# Patient Record
Sex: Female | Born: 1960 | Hispanic: Yes | Marital: Married | Smoking: Never smoker
Health system: Southern US, Community
[De-identification: ages and names within clinical notes are randomized; demographics above are authoritative.]

## PROBLEM LIST (undated history)

## (undated) DIAGNOSIS — F329 Major depressive disorder, single episode, unspecified: Secondary | ICD-10-CM

## (undated) DIAGNOSIS — I10 Essential (primary) hypertension: Secondary | ICD-10-CM

## (undated) DIAGNOSIS — F32A Depression, unspecified: Secondary | ICD-10-CM

## (undated) DIAGNOSIS — I509 Heart failure, unspecified: Secondary | ICD-10-CM

## (undated) DIAGNOSIS — R32 Unspecified urinary incontinence: Secondary | ICD-10-CM

## (undated) HISTORY — PX: OVARY SURGERY: SHX727

## (undated) HISTORY — DX: Essential (primary) hypertension: I10

## (undated) HISTORY — PX: ABDOMINAL HYSTERECTOMY: SHX81

## (undated) HISTORY — DX: Heart failure, unspecified: I50.9

## (undated) HISTORY — DX: Unspecified urinary incontinence: R32

---

## 1984-05-14 HISTORY — PX: SALPINGOOPHORECTOMY: SHX82

## 2015-02-21 ENCOUNTER — Emergency Department: Payer: BC Managed Care – PPO

## 2015-02-21 ENCOUNTER — Emergency Department
Admission: EM | Admit: 2015-02-21 | Discharge: 2015-02-21 | Disposition: A | Discharge status: Home / Self Care | Payer: BC Managed Care – PPO | Attending: Emergency Medicine | Admitting: Emergency Medicine

## 2015-02-21 ENCOUNTER — Other Ambulatory Visit: Payer: Self-pay

## 2015-02-21 DIAGNOSIS — F419 Anxiety disorder, unspecified: Secondary | ICD-10-CM | POA: Insufficient documentation

## 2015-02-21 DIAGNOSIS — R519 Headache, unspecified: Secondary | ICD-10-CM

## 2015-02-21 DIAGNOSIS — R42 Dizziness and giddiness: Secondary | ICD-10-CM | POA: Diagnosis not present

## 2015-02-21 DIAGNOSIS — R51 Headache: Secondary | ICD-10-CM | POA: Insufficient documentation

## 2015-02-21 DIAGNOSIS — M6281 Muscle weakness (generalized): Secondary | ICD-10-CM | POA: Diagnosis not present

## 2015-02-21 DIAGNOSIS — R531 Weakness: Secondary | ICD-10-CM

## 2015-02-21 LAB — BASIC METABOLIC PANEL
ANION GAP: 6 (ref 5–15)
BUN: 17 mg/dL (ref 6–20)
CALCIUM: 10 mg/dL (ref 8.9–10.3)
CO2: 29 mmol/L (ref 22–32)
Chloride: 106 mmol/L (ref 101–111)
Creatinine, Ser: 0.85 mg/dL (ref 0.44–1.00)
GFR calc Af Amer: >60 mL/min (ref >60)
GLUCOSE: 106 mg/dL — AB (ref 65–99)
Potassium: 3.5 mmol/L (ref 3.5–5.1)
Sodium: 141 mmol/L (ref 135–145)

## 2015-02-21 LAB — CBC
HEMATOCRIT: 38.5 % (ref 35.0–47.0)
HEMOGLOBIN: 12.9 g/dL (ref 12.0–16.0)
MCH: 28 pg (ref 26.0–34.0)
MCHC: 33.5 g/dL (ref 32.0–36.0)
MCV: 83.7 fL (ref 80.0–100.0)
Platelets: 289 10*3/uL (ref 150–440)
RBC: 4.6 MIL/uL (ref 3.80–5.20)
RDW: 14.7 % — ABNORMAL HIGH (ref 11.5–14.5)
WBC: 6.7 10*3/uL (ref 3.6–11.0)

## 2015-02-21 LAB — TROPONIN I: Troponin I: <0.03 ng/mL (ref <0.031)

## 2015-02-21 MED ORDER — ONDANSETRON HCL 4 MG/2ML IJ SOLN
4.0000 mg | Freq: Once | INTRAMUSCULAR | Status: AC
  Administered 2015-02-21: 4 mg via INTRAVENOUS

## 2015-02-21 MED ORDER — SODIUM CHLORIDE 0.9 % IV SOLN
1000.0000 mL | Freq: Once | INTRAVENOUS | Status: AC
  Administered 2015-02-21: 1000 mL via INTRAVENOUS

## 2015-02-21 MED ORDER — ONDANSETRON HCL 4 MG/2ML IJ SOLN
INTRAMUSCULAR | Status: AC
  Administered 2015-02-21: 4 mg via INTRAVENOUS
  Filled 2015-02-21: qty 2

## 2015-02-21 MED ORDER — KETOROLAC TROMETHAMINE 30 MG/ML IJ SOLN
30.0000 mg | Freq: Once | INTRAMUSCULAR | Status: AC
  Administered 2015-02-21: 30 mg via INTRAVENOUS
  Filled 2015-02-21: qty 1

## 2015-02-21 NOTE — Discharge Instructions (Signed)
Debilidad  (Weakness)  La debilidad es la falta de energa. Puede sentir debilidad en todo el cuerpo o slo en [employer]. La debilidad puede ser un sntoma grave. En algunos casos podra necesitar hacer ms estudios diagnsticos. CUIDADOS EN EL HOGAR   Haga reposo.  Consuma una dieta normal y bien balanceada.  Trate de Materials engineer [employer].  Tome slo los medicamentos que le haya indicado el mdico. SOLICITE AYUDA DE INMEDIATO SI:   No puede hacer sus actividades habituales.  No puede subir ni bajar escaleras o se siente muy cansado al [employer].  Siente que le falta el aire o dolor en el pecho.  Tiene dificultad para mover algunas partes de su cuerpo.  Siente debilidad en una parte del cuerpo o slo en un lado del cuerpo.  Tiene fiebre.  Tiene problemas para hablar o tragar.  No puede controlar el pis (la orina) o las heces (materia fecal).  El (vmito) o las heces son negras o Investment banker, operational.  La debilidad empeora o se extiende a otras partes del cuerpo.  Siente nuevos dolores. ASEGRESE DE QUE:   Comprende estas instrucciones.  Controlar su enfermedad.  Solicitar ayuda de inmediato si no mejora o si empeora.   Esta informacin no tiene Theme park manager el consejo del mdico. Asegrese de hacerle al mdico cualquier pregunta que tenga.   Document Released: 07/27/2008 Document Revised: 10/30/2011 Elsevier Interactive Patient Education [employer]

## 2015-02-21 NOTE — ED Notes (Signed)
Pt presents from home with weakness especially in lower extremities that started yesterday morning. "it feels like my legs can't hold my weight". Pt states she has felt dizzy since yesterday even at rest/headache. Pt alert and oriented on arrival.

## 2015-02-21 NOTE — ED Notes (Signed)
AAOx3.  Skin warm and dry.  Moving all extremities equally and strong. Ambulates with easy and steady gait.   

## 2015-02-21 NOTE — ED Provider Notes (Signed)
[facility] Emergency Department Provider Note  ____________________________________________  Time seen: 1 PM  I have reviewed the triage vital signs and the nursing notes.   HISTORY  Chief Complaint Extremity Weakness; Headache; and Dizziness    HPI Savannah Myers is a 54 y.o. female who complains primarily of fatigue but also of a headache some dizziness, and generalized weakness. She denies fevers chills. She does report a localized general throbbing headache which is not uncommon for her. She does not have neck pain or back pain. No chest pain or shortness of breath. No recent travel. No lower extremity swelling. No nausea vomiting or abdominal pain     No past medical history on file.  There are no active problems to display for this patient.   No past surgical history on file.  No current outpatient prescriptions on file.  Allergies Review of patient's allergies indicates not on file.  No family history on file.  Social History Social History  Substance Use Topics  . Smoking status: Not on file  . Smokeless tobacco: Not on file  . Alcohol Use: Not on file    Review of Systems  Constitutional: Negative for fever. Generalized weakness Eyes: Negative for visual changes. ENT: Negative for sore throat Cardiovascular: Negative for chest pain. Respiratory: Negative for shortness of breath. Gastrointestinal: Negative for abdominal pain, vomiting and diarrhea. Genitourinary: Negative for dysuria. Musculoskeletal: Negative for back pain. Skin: Negative for rash. Neurological: Negative for  focal weakness. Positive for headache Psychiatric: Mild anxiety    ____________________________________________   PHYSICAL EXAM:  VITAL SIGNS: ED Triage Vitals  Enc Vitals Group     BP 02/21/15 1111 146/80 mmHg     Pulse Rate 02/21/15 1111 60     Resp 02/21/15 1111 16     Temp 02/21/15 1111 98 F (36.7 C)     Temp Source 02/21/15 1111  Oral     SpO2 02/21/15 1111 94 %     Weight 02/21/15 1111 152 lb (68.947 kg)     Height 02/21/15 1111  (1.676 m)     Head Cir --      Peak Flow --      Pain Score 02/21/15 1238 5     Pain Loc --      Pain Edu? --      Excl. in GC? --      Constitutional: Alert and oriented. Well appearing and in no distress. Eyes: Conjunctivae are normal.  ENT   Head: Normocephalic and atraumatic.   Mouth/Throat: Mucous membranes are moist. Cardiovascular: Normal rate, regular rhythm. Normal and symmetric distal pulses are present in all extremities. No murmurs, rubs, or gallops. Respiratory: Normal respiratory effort without tachypnea nor retractions. Breath sounds are clear and equal bilaterally.  Gastrointestinal: Soft and non-tender in all quadrants. No distention. There is no CVA tenderness. Genitourinary: deferred Musculoskeletal: Nontender with normal range of motion in all extremities. No lower extremity tenderness nor edema. Neurologic:  Normal speech and language. No gross focal neurologic deficits are appreciated. Normal strength in all extremities Skin:  Skin is warm, dry and intact. No rash noted. Psychiatric: Mood and affect are normal. Patient exhibits appropriate insight and judgment.  ____________________________________________    LABS (pertinent positives/negatives)  Labs Reviewed  BASIC METABOLIC PANEL - Abnormal; Notable for the following:    Glucose, Bld 106 (*)    All other components within normal limits  CBC - Abnormal; Notable for the following:    RDW 14.7 (*)  All other components within normal limits  TROPONIN I    ____________________________________________   EKG  ED ECG REPORT I, Jene Every, the attending physician, personally viewed and interpreted this ECG.  Date: 02/21/2015 EKG Time: 11:04 AM Rate: 59 Rhythm: Sinus bradycardia QRS Axis: normal Intervals: normal ST/T Wave abnormalities: normal Conduction Disutrbances:  none Narrative Interpretation: unremarkable   ____________________________________________    RADIOLOGY I have personally reviewed any xrays that were ordered on this patient: CT head unremarkable  ____________________________________________   PROCEDURES  Procedure(s) performed: none  Critical Care performed: none  ____________________________________________   INITIAL IMPRESSION / ASSESSMENT AND PLAN / ED COURSE  Pertinent labs & imaging results that were available during my care of the patient were reviewed by me and considered in my medical decision making (see chart for details).  Patient overall well-appearing with unremarkable vital signs. She complains of mild dizziness, fatigue and a headache. She is given Toradol IV for her headache which completely resolved her symptoms. Her CT head was unremarkable. She received IV fluids in the emergency department. She reports after the fluid she felt significant better and no longer feels dizzy or weak. I'll discharge the patient with supportive care and PCP follow-up. Instructions to return if any worsening symptoms to the ED  ____________________________________________   FINAL CLINICAL IMPRESSION(S) / ED DIAGNOSES  Final diagnoses:  Acute nonintractable headache, unspecified headache type  Generalized weakness     Jene Every, MD 02/21/15 1517

## 2015-02-23 DIAGNOSIS — R002 Palpitations: Secondary | ICD-10-CM | POA: Insufficient documentation

## 2015-02-28 DIAGNOSIS — I10 Essential (primary) hypertension: Secondary | ICD-10-CM | POA: Insufficient documentation

## 2015-03-07 DIAGNOSIS — R42 Dizziness and giddiness: Secondary | ICD-10-CM | POA: Insufficient documentation

## 2015-03-17 ENCOUNTER — Ambulatory Visit: Payer: BC Managed Care – PPO | Admitting: Cardiovascular Disease

## 2015-03-23 ENCOUNTER — Ambulatory Visit: Payer: BC Managed Care – PPO | Admitting: Nurse Practitioner

## 2015-07-14 ENCOUNTER — Other Ambulatory Visit: Payer: Self-pay | Admitting: Internal Medicine

## 2015-07-14 DIAGNOSIS — Z1239 Encounter for other screening for malignant neoplasm of breast: Secondary | ICD-10-CM

## 2015-07-14 DIAGNOSIS — Z862 Personal history of diseases of the blood and blood-forming organs and certain disorders involving the immune mechanism: Secondary | ICD-10-CM | POA: Insufficient documentation

## 2015-07-25 ENCOUNTER — Ambulatory Visit: Payer: BC Managed Care – PPO | Attending: Internal Medicine

## 2015-08-22 DIAGNOSIS — I493 Ventricular premature depolarization: Secondary | ICD-10-CM | POA: Insufficient documentation

## 2015-11-18 ENCOUNTER — Ambulatory Visit
Admission: RE | Admit: 2015-11-18 | Discharge: 2015-11-18 | Disposition: A | Discharge status: Home / Self Care | Payer: BC Managed Care – PPO | Source: Ambulatory Visit | Attending: Internal Medicine | Admitting: Internal Medicine

## 2015-11-18 ENCOUNTER — Other Ambulatory Visit: Payer: Self-pay | Admitting: Internal Medicine

## 2015-11-18 DIAGNOSIS — Z1231 Encounter for screening mammogram for malignant neoplasm of breast: Secondary | ICD-10-CM | POA: Diagnosis not present

## 2015-11-18 DIAGNOSIS — Z1239 Encounter for other screening for malignant neoplasm of breast: Secondary | ICD-10-CM

## 2015-11-23 ENCOUNTER — Other Ambulatory Visit: Payer: Self-pay | Admitting: Internal Medicine

## 2015-11-23 DIAGNOSIS — N6310 Unspecified lump in the right breast, unspecified quadrant: Secondary | ICD-10-CM

## 2015-11-24 ENCOUNTER — Ambulatory Visit
Admission: RE | Admit: 2015-11-24 | Discharge: 2015-11-24 | Disposition: A | Discharge status: Home / Self Care | Payer: BC Managed Care – PPO | Source: Ambulatory Visit | Attending: Internal Medicine | Admitting: Internal Medicine

## 2015-11-24 DIAGNOSIS — N63 Unspecified lump in breast: Secondary | ICD-10-CM | POA: Diagnosis not present

## 2015-11-24 DIAGNOSIS — N6310 Unspecified lump in the right breast, unspecified quadrant: Secondary | ICD-10-CM

## 2016-07-20 DIAGNOSIS — I10 Essential (primary) hypertension: Secondary | ICD-10-CM | POA: Diagnosis not present

## 2016-07-20 DIAGNOSIS — K219 Gastro-esophageal reflux disease without esophagitis: Secondary | ICD-10-CM | POA: Diagnosis not present

## 2016-07-20 DIAGNOSIS — F5104 Psychophysiologic insomnia: Secondary | ICD-10-CM | POA: Diagnosis not present

## 2016-07-20 DIAGNOSIS — E7800 Pure hypercholesterolemia, unspecified: Secondary | ICD-10-CM | POA: Diagnosis not present

## 2016-08-16 DIAGNOSIS — H5203 Hypermetropia, bilateral: Secondary | ICD-10-CM | POA: Diagnosis not present

## 2016-08-16 DIAGNOSIS — H52223 Regular astigmatism, bilateral: Secondary | ICD-10-CM | POA: Diagnosis not present

## 2016-08-16 DIAGNOSIS — H524 Presbyopia: Secondary | ICD-10-CM | POA: Diagnosis not present

## 2016-10-12 DIAGNOSIS — I10 Essential (primary) hypertension: Secondary | ICD-10-CM | POA: Diagnosis not present

## 2016-10-12 DIAGNOSIS — K219 Gastro-esophageal reflux disease without esophagitis: Secondary | ICD-10-CM | POA: Diagnosis not present

## 2016-10-12 DIAGNOSIS — Z131 Encounter for screening for diabetes mellitus: Secondary | ICD-10-CM | POA: Diagnosis not present

## 2016-10-12 DIAGNOSIS — E7800 Pure hypercholesterolemia, unspecified: Secondary | ICD-10-CM | POA: Diagnosis not present

## 2016-10-16 ENCOUNTER — Other Ambulatory Visit: Payer: Self-pay | Admitting: Internal Medicine

## 2016-10-16 DIAGNOSIS — Z1231 Encounter for screening mammogram for malignant neoplasm of breast: Secondary | ICD-10-CM

## 2016-10-19 DIAGNOSIS — R7303 Prediabetes: Secondary | ICD-10-CM | POA: Diagnosis not present

## 2016-10-19 DIAGNOSIS — S39012A Strain of muscle, fascia and tendon of lower back, initial encounter: Secondary | ICD-10-CM | POA: Insufficient documentation

## 2016-10-19 DIAGNOSIS — Z0000 Encounter for general adult medical examination without abnormal findings: Secondary | ICD-10-CM | POA: Diagnosis not present

## 2016-10-19 DIAGNOSIS — Z23 Encounter for immunization: Secondary | ICD-10-CM | POA: Diagnosis not present

## 2016-10-19 DIAGNOSIS — Z780 Asymptomatic menopausal state: Secondary | ICD-10-CM | POA: Diagnosis not present

## 2016-10-20 DIAGNOSIS — B351 Tinea unguium: Secondary | ICD-10-CM | POA: Insufficient documentation

## 2016-10-23 DIAGNOSIS — M8588 Other specified disorders of bone density and structure, other site: Secondary | ICD-10-CM | POA: Diagnosis not present

## 2016-11-22 ENCOUNTER — Ambulatory Visit: Payer: BC Managed Care – PPO

## 2017-01-22 DIAGNOSIS — B351 Tinea unguium: Secondary | ICD-10-CM | POA: Diagnosis not present

## 2017-02-01 DIAGNOSIS — J029 Acute pharyngitis, unspecified: Secondary | ICD-10-CM | POA: Diagnosis not present

## 2017-02-01 DIAGNOSIS — B349 Viral infection, unspecified: Secondary | ICD-10-CM | POA: Diagnosis not present

## 2017-02-08 ENCOUNTER — Ambulatory Visit
Admission: RE | Admit: 2017-02-08 | Discharge: 2017-02-08 | Disposition: A | Discharge status: Home / Self Care | Payer: BC Managed Care – PPO | Source: Ambulatory Visit | Attending: Internal Medicine | Admitting: Internal Medicine

## 2017-02-08 DIAGNOSIS — M533 Sacrococcygeal disorders, not elsewhere classified: Secondary | ICD-10-CM | POA: Diagnosis not present

## 2017-02-08 DIAGNOSIS — W108XXA Fall (on) (from) other stairs and steps, initial encounter: Secondary | ICD-10-CM | POA: Diagnosis not present

## 2017-02-08 DIAGNOSIS — Z1231 Encounter for screening mammogram for malignant neoplasm of breast: Secondary | ICD-10-CM | POA: Diagnosis not present

## 2017-02-11 DIAGNOSIS — E7800 Pure hypercholesterolemia, unspecified: Secondary | ICD-10-CM | POA: Insufficient documentation

## 2017-02-11 DIAGNOSIS — K219 Gastro-esophageal reflux disease without esophagitis: Secondary | ICD-10-CM | POA: Diagnosis not present

## 2017-02-11 DIAGNOSIS — I10 Essential (primary) hypertension: Secondary | ICD-10-CM | POA: Diagnosis not present

## 2017-02-11 DIAGNOSIS — I83813 Varicose veins of bilateral lower extremities with pain: Secondary | ICD-10-CM | POA: Diagnosis not present

## 2017-03-06 ENCOUNTER — Encounter (INDEPENDENT_AMBULATORY_CARE_PROVIDER_SITE_OTHER): Payer: Self-pay | Admitting: Vascular Surgery

## 2017-03-06 ENCOUNTER — Ambulatory Visit (INDEPENDENT_AMBULATORY_CARE_PROVIDER_SITE_OTHER): Payer: BLUE CROSS/BLUE SHIELD | Admitting: Vascular Surgery

## 2017-03-06 VITALS — BP 137/77 | HR 51 | Resp 17 | Ht 60.0 in | Wt 165.0 lb

## 2017-03-06 DIAGNOSIS — I83813 Varicose veins of bilateral lower extremities with pain: Secondary | ICD-10-CM | POA: Insufficient documentation

## 2017-03-06 DIAGNOSIS — R600 Localized edema: Secondary | ICD-10-CM | POA: Diagnosis not present

## 2017-03-06 DIAGNOSIS — E785 Hyperlipidemia, unspecified: Secondary | ICD-10-CM | POA: Diagnosis not present

## 2017-03-06 NOTE — Progress Notes (Signed)
Subjective:    Patient ID: Savannah Myers, female    DOB: 09/06/1960, 56 y.o.   MRN: [id number] Chief Complaint  Patient presents with  . Varicose Veins    ref Birdie HopesSingh   Presents as a new patient referred by Dr. Leta SpellerSignh for evaluation of "painful varicose veins". Patient endorses a long-standing history of progressively painful varicose veins located to the bilateral lower extremity. Patient states that she was "treated" in her country many years ago. ? Sclerotherapy. She states her varicosities have become larger and more painful over the last few years. This prompted her to seek medical attention. Patient notes her discomfort increases towards the end of the day. Her discomfort is also associated with lower extremity edema. The edema is located mostly to the calf and ankles. She states this worsens towards the end of the day. She denies any claudication, rest pain or ulceration to the lower extremity. She denies any recent surgery or trauma to the lower extremity. She denies any DVT history to the lower extremity. At this time, the patient does not engage in conservative therapy. Patient denies any fever, nausea or vomiting.   Review of Systems  Constitutional: Negative.   HENT: Negative.   Eyes: Negative.   Respiratory: Negative.   Cardiovascular: Positive for leg swelling.       Varicose veins  Gastrointestinal: Negative.   Endocrine: Negative.   Genitourinary: Negative.   Musculoskeletal: Negative.   Skin: Negative.   Allergic/Immunologic: Negative.   Neurological: Negative.   Hematological: Negative.   Psychiatric/Behavioral: Negative.       Objective:   Physical Exam  Constitutional: She is oriented to person, place, and time. She appears well-developed and well-nourished. No distress.  HENT:  Head: Normocephalic and atraumatic.  Eyes: Conjunctivae are normal.  Neck: Normal range of motion.  Cardiovascular: Normal rate, regular rhythm, normal heart sounds and intact  distal pulses.   Pulses:      Radial pulses are 2+ on the right side, and 2+ on the left side.       Dorsalis pedis pulses are 2+ on the right side, and 2+ on the left side.       Posterior tibial pulses are 2+ on the right side, and 2+ on the left side.  Pulmonary/Chest: Effort normal.  Musculoskeletal: Normal range of motion. She exhibits edema (mild bilateral lower extremity edema).  Neurological: She is alert and oriented to person, place, and time.  Skin: She is not diaphoretic.  Less than 1 cm scattered spider veins noted. Less than 1 cm varicosities noted. There is no cellulitis. There is no stasis dermatitis. There is no skin changes.  Psychiatric: She has a normal mood and affect. Her behavior is normal. Judgment and thought content normal.  Vitals reviewed.  BP 137/77 (BP Location: Right Arm)   Pulse (!) 51   Resp 17   Ht 5' (1.524 m)   Wt 165 lb (74.8 kg)   LMP  (LMP Unknown)   BMI 32.22 kg/m   Past Medical History:  Diagnosis Date  . Hypertension    Social History   Social History  . Marital status: Married    Spouse name: N/A  . Number of children: N/A  . Years of education: N/A   Occupational History  . Not on file.   Social History Main Topics  . Smoking status: Never Smoker  . Smokeless tobacco: Never Used  . Alcohol use No  . Drug use: No  . Sexual  activity: Not on file   Other Topics Concern  . Not on file   Social History Narrative  . No narrative on file   Past Surgical History:  Procedure Laterality Date  . ABDOMINAL HYSTERECTOMY     Family History  Problem Relation Age of Onset  . Breast cancer Maternal Grandmother 8   No Known Allergies     Assessment & Plan:  Presents as a new patient referred by Dr. Leta Speller for evaluation of "painful varicose veins". Patient endorses a long-standing history of progressively painful varicose veins located to the bilateral lower extremity. Patient states that she was "treated" in her country many  years ago. ? Sclerotherapy. She states her varicosities have become larger and more painful over the last few years. This prompted her to seek medical attention. Patient notes her discomfort increases towards the end of the day. Her discomfort is also associated with lower extremity edema. The edema is located mostly to the calf and ankles. She states this worsens towards the end of the day. She denies any claudication, rest pain or ulceration to the lower extremity. She denies any recent surgery or trauma to the lower extremity. She denies any DVT history to the lower extremity. At this time, the patient does not engage in conservative therapy. Patient denies any fever, nausea or vomiting.  1. Varicose veins of both lower extremities with pain - New  The patient was encouraged to wear graduated compression stockings (20-30 mmHg) on a daily basis. The patient was instructed to begin wearing the stockings first thing in the morning and removing them in the evening. The patient was instructed specifically not to sleep in the stockings. Prescription given.  In addition, behavioral modification including elevation during the day will be initiated. Anti-inflammatories for pain. The patient will follow up in three months to asses conservative management.  Information on chronic venous insufficiency and compression stockings was given to the patient. The patient was instructed to call the office in the interim if any worsening edema or ulcerations to the legs, feet or toes occurs. The patient expresses their understanding.  - VAS [place] LOWER EXTREMITY VENOUS REFLUX; Future  2. Hyperlipidemia, unspecified hyperlipidemia type - Stable Encouraged good control as its slows the progression of atherosclerotic disease  3. Bilateral lower extremity edema - New As above  No current outpatient prescriptions on file prior to visit.   No current facility-administered medications on file prior to visit.     There  are no Patient Instructions on file for this visit. No Follow-up on file.   Marlow Berenguer A Jamari Diana, PA-C

## 2017-03-13 ENCOUNTER — Encounter (INDEPENDENT_AMBULATORY_CARE_PROVIDER_SITE_OTHER): Payer: BC Managed Care – PPO | Admitting: Vascular Surgery

## 2017-03-13 DIAGNOSIS — I10 Essential (primary) hypertension: Secondary | ICD-10-CM | POA: Diagnosis not present

## 2017-03-13 DIAGNOSIS — I493 Ventricular premature depolarization: Secondary | ICD-10-CM | POA: Diagnosis not present

## 2017-03-13 DIAGNOSIS — R002 Palpitations: Secondary | ICD-10-CM | POA: Diagnosis not present

## 2017-03-13 DIAGNOSIS — E782 Mixed hyperlipidemia: Secondary | ICD-10-CM | POA: Diagnosis not present

## 2017-03-16 ENCOUNTER — Emergency Department: Payer: BLUE CROSS/BLUE SHIELD

## 2017-03-16 ENCOUNTER — Emergency Department
Admission: EM | Admit: 2017-03-16 | Discharge: 2017-03-17 | Disposition: A | Discharge status: Home / Self Care | Payer: BLUE CROSS/BLUE SHIELD | Attending: Emergency Medicine | Admitting: Emergency Medicine

## 2017-03-16 DIAGNOSIS — S8001XA Contusion of right knee, initial encounter: Secondary | ICD-10-CM | POA: Insufficient documentation

## 2017-03-16 DIAGNOSIS — Y929 Unspecified place or not applicable: Secondary | ICD-10-CM | POA: Insufficient documentation

## 2017-03-16 DIAGNOSIS — Z79899 Other long term (current) drug therapy: Secondary | ICD-10-CM | POA: Insufficient documentation

## 2017-03-16 DIAGNOSIS — I10 Essential (primary) hypertension: Secondary | ICD-10-CM | POA: Diagnosis not present

## 2017-03-16 DIAGNOSIS — W108XXA Fall (on) (from) other stairs and steps, initial encounter: Secondary | ICD-10-CM | POA: Diagnosis not present

## 2017-03-16 DIAGNOSIS — T148XXA Other injury of unspecified body region, initial encounter: Secondary | ICD-10-CM

## 2017-03-16 DIAGNOSIS — Y998 Other external cause status: Secondary | ICD-10-CM | POA: Insufficient documentation

## 2017-03-16 DIAGNOSIS — Y9389 Activity, other specified: Secondary | ICD-10-CM | POA: Diagnosis not present

## 2017-03-16 DIAGNOSIS — M25561 Pain in right knee: Secondary | ICD-10-CM | POA: Diagnosis not present

## 2017-03-16 DIAGNOSIS — W109XXA Fall (on) (from) unspecified stairs and steps, initial encounter: Secondary | ICD-10-CM | POA: Diagnosis not present

## 2017-03-16 DIAGNOSIS — S8991XA Unspecified injury of right lower leg, initial encounter: Secondary | ICD-10-CM | POA: Diagnosis present

## 2017-03-16 DIAGNOSIS — S50312A Abrasion of left elbow, initial encounter: Secondary | ICD-10-CM | POA: Diagnosis not present

## 2017-03-16 HISTORY — DX: Depression, unspecified: F32.A

## 2017-03-16 HISTORY — DX: Major depressive disorder, single episode, unspecified: F32.9

## 2017-03-16 MED ORDER — KETOROLAC TROMETHAMINE 30 MG/ML IJ SOLN
15.0000 mg | Freq: Once | INTRAMUSCULAR | Status: AC
  Administered 2017-03-16: 15 mg via INTRAVENOUS

## 2017-03-16 MED ORDER — NAPROXEN 500 MG PO TABS: 500.0000 mg | ORAL_TABLET | Freq: Two times a day (BID) | ORAL | 0 refills | Status: DC

## 2017-03-16 MED ORDER — KETOROLAC TROMETHAMINE 30 MG/ML IJ SOLN
INTRAMUSCULAR | Status: AC
  Administered 2017-03-16: 15 mg via INTRAVENOUS
  Filled 2017-03-16: qty 1

## 2017-03-16 NOTE — ED Notes (Signed)
Clean, sterile dressing applied to abrasion on patient's left elbow

## 2017-03-16 NOTE — ED Notes (Signed)
X-ray at bedside

## 2017-03-16 NOTE — Discharge Instructions (Signed)
Your knee xray was unremarkable.  We don't see any serious injuries from the fall.  Use crutches and anti-inflammatory pain medication to control your symptoms and follow up with your doctor.

## 2017-03-16 NOTE — ED Provider Notes (Signed)
[facility] Emergency Department Provider Note  ____________________________________________  Time seen: Approximately 11:38 PM  I have reviewed the triage vital signs and the nursing notes.   HISTORY  Chief Complaint Fall (knee, hip and elbow pain)    HPI Savannah Myers is a 56 y.o. female who reports a mechanical fall where she slipped and fell at the top of the stairs while reaching for a medicine for her child. She fell down a few steps was able to catch herself with her arm against the railing. No head injury or loss of consciousness. Denies neck pain. Complains of pain in the right thigh and knee. Also at the left elbow. No chest pain shortness of breath or abdominal pain. No bleeding or lacerations. Pains are nonradiating, worse with movement.     Past Medical History:  Diagnosis Date  . Depression   . Hypertension      Patient Active Problem List   Diagnosis Date Noted  . Varicose veins of both lower extremities with pain 03/06/2017  . Hyperlipidemia 03/06/2017  . Bilateral lower extremity edema 03/06/2017     Past Surgical History:  Procedure Laterality Date  . ABDOMINAL HYSTERECTOMY       Prior to Admission medications   Medication Sig Start Date End Date Taking? Authorizing Provider  candesartan (ATACAND) 32 MG tablet Take 32 mg by mouth daily.   Yes [provider]  Melatonin 5 MG TABS Take by mouth.   Yes [provider]  meloxicam (MOBIC) 7.5 MG tablet Take 1-2 tablets daily as needed for pain 10/19/16  Yes [provider]  pantoprazole (PROTONIX) 40 MG tablet Take 40 mg by mouth daily.    Yes [provider]  naproxen (NAPROSYN) 500 MG tablet Take 1 tablet (500 mg total) by mouth 2 (two) times daily with a meal. 03/16/17   Sharman CheekStafford, Divine Hansley, MD     Allergies Patient has no known allergies.   Family History  Problem Relation Age of Onset  . Breast cancer Maternal Grandmother [id number]     Social History Social History  Substance Use Topics  . Smoking status: Never Smoker  . Smokeless tobacco: Never Used  . Alcohol use No    Review of Systems  Constitutional:   No fever or chills.  ENT:   No sore throat. No rhinorrhea. Cardiovascular:   No chest pain or syncope. Respiratory:   No dyspnea or cough. Gastrointestinal:   Negative for abdominal pain, vomiting and diarrhea.  Musculoskeletal:   Multiple musculoskeletal complaints as above All other systems reviewed and are negative except as documented above in ROS and HPI.  ____________________________________________   PHYSICAL EXAM:  VITAL SIGNS: ED Triage Vitals  Enc Vitals Group     BP 03/16/17 2210 (!) 171/125     Pulse Rate 03/16/17 2210 73     Resp 03/16/17 2210 17     Temp 03/16/17 2210 98.1 F (36.7 C)     Temp Source 03/16/17 2210 Oral     SpO2 03/16/17 2210 100 %     Weight 03/16/17 2211 165 lb (74.8 kg)     Height 03/16/17 2211 5' (1.524 m)     Head Circumference --      Peak Flow --      Pain Score 03/16/17 2209 10     Pain Loc --      Pain Edu? --      Excl. in GC? --     Vital signs reviewed,  nursing assessments reviewed.   Constitutional:   Alert and oriented. Well appearing and in no distress. Tearful Eyes:   No scleral icterus.  EOMI. perrl. ENT   Head:   Normocephalic and atraumatic.   Nose:   No congestion/rhinnorhea.    Mouth/Throat:   MMM, no pharyngeal erythema. No peritonsillar mass.    Neck:   No meningismus. Full ROM. No midline tenderness Hematological/Lymphatic/Immunilogical:   No cervical lymphadenopathy. Cardiovascular:   RRR. Symmetric bilateral radial and DP pulses.  No murmurs.  Respiratory:   Normal respiratory effort without tachypnea/retractions. Breath sounds are clear and equal bilaterally. No wheezes/rales/rhonchi. Gastrointestinal:   Soft and nontender. Non distended. There is no CVA tenderness.  No rebound, rigidity, or  guarding. Genitourinary:   deferred Musculoskeletal:   Normal range of motion of the right hip. Mild tenderness diffusely at the posterior right knee, joint is located, no bony point tenderness. Joint is stable. Lower right leg and left leg are unremarkable. Patient resists range of motion of the right knee due to pain, but is able to move it. Neurologic:   Normal speech and language.  Motor grossly intact. No gross focal neurologic deficits are appreciated.  Skin:    Skin is warm, dry with a 1 cm superficial abrasion on the left elbow involving only the epidermis. No rash noted.  No petechiae, purpura, or bullae.  ____________________________________________    LABS (pertinent positives/negatives) (all labs ordered are listed, but only abnormal results are displayed) Labs Reviewed - No data to display ____________________________________________   EKG    ____________________________________________    RADIOLOGY  Dg Knee Complete 4 Views Right  Result Date: 03/16/2017 CLINICAL DATA:  56 year old female with right knee pain after fall. EXAM: RIGHT KNEE - COMPLETE 4+ VIEW COMPARISON:  None. FINDINGS: There is no acute fracture or dislocation. No significant arthritic changes. No joint effusion. The soft tissues appear unremarkable. IMPRESSION: Negative. Electronically Signed   By: Elgie Collard M.D.   On: 03/16/2017 23:03    ____________________________________________   PROCEDURES Procedures  ____________________________________________   DIFFERENTIAL DIAGNOSIS  Knee sprain, contusion, fracture. Left elbow abrasion  CLINICAL IMPRESSION / ASSESSMENT AND PLAN / ED COURSE  Pertinent labs & imaging results that were available during my care of the patient were reviewed by me and considered in my medical decision making (see chart for details).   Patient presents after minor mechanical fall. She is tearful which she relates to being upset about the fall and worried  that she may have a serious injury. However, exam is actually quite reassuring. X-ray of the right knee is negative. Vital signs are unremarkable. We'll provide Ace wrap and crutches for support and comfort. NSAIDs, follow up with primary care. Doubt any significant ligamentous injury. No evidence of dislocation or vascular injury. No spinal or neuro imaging needed.      ____________________________________________   FINAL CLINICAL IMPRESSION(S) / ED DIAGNOSES    Final diagnoses:  Contusion of right knee, initial encounter  Abrasion      New Prescriptions   NAPROXEN (NAPROSYN) 500 MG TABLET    Take 1 tablet (500 mg total) by mouth 2 (two) times daily with a meal.     Portions of this note were generated with dragon dictation software. Dictation errors may occur despite best attempts at proofreading.    Sharman Cheek, MD 03/16/17 2342

## 2017-03-16 NOTE — ED Triage Notes (Signed)
Patient reports mechanical fall today. Patient c/o right hip/knee pain and left elbow pain. Patient has abrasion to left elbow

## 2017-03-17 NOTE — ED Notes (Signed)
Reviewed d/c instructions, follow-up care, prescription, use of ice/elevation, crutch use, ace bandage use with patient. Patient verbalized understanding. Patient performed return demonstration of crutch use.

## 2017-03-19 DIAGNOSIS — I10 Essential (primary) hypertension: Secondary | ICD-10-CM | POA: Diagnosis not present

## 2017-03-19 DIAGNOSIS — M25561 Pain in right knee: Secondary | ICD-10-CM | POA: Diagnosis not present

## 2017-06-10 ENCOUNTER — Ambulatory Visit (INDEPENDENT_AMBULATORY_CARE_PROVIDER_SITE_OTHER): Payer: BLUE CROSS/BLUE SHIELD | Admitting: Vascular Surgery

## 2017-06-10 ENCOUNTER — Encounter (INDEPENDENT_AMBULATORY_CARE_PROVIDER_SITE_OTHER): Payer: BLUE CROSS/BLUE SHIELD

## 2017-06-17 ENCOUNTER — Encounter (INDEPENDENT_AMBULATORY_CARE_PROVIDER_SITE_OTHER): Payer: Self-pay | Admitting: Vascular Surgery

## 2017-06-17 ENCOUNTER — Ambulatory Visit (INDEPENDENT_AMBULATORY_CARE_PROVIDER_SITE_OTHER): Payer: BLUE CROSS/BLUE SHIELD

## 2017-06-17 ENCOUNTER — Ambulatory Visit (INDEPENDENT_AMBULATORY_CARE_PROVIDER_SITE_OTHER): Payer: BLUE CROSS/BLUE SHIELD | Admitting: Vascular Surgery

## 2017-06-17 VITALS — BP 124/75 | HR 63 | Resp 17 | Wt 166.0 lb

## 2017-06-17 DIAGNOSIS — I83813 Varicose veins of bilateral lower extremities with pain: Secondary | ICD-10-CM

## 2017-06-17 DIAGNOSIS — R600 Localized edema: Secondary | ICD-10-CM | POA: Diagnosis not present

## 2017-06-18 ENCOUNTER — Encounter (INDEPENDENT_AMBULATORY_CARE_PROVIDER_SITE_OTHER): Payer: Self-pay | Admitting: Vascular Surgery

## 2017-06-18 NOTE — Progress Notes (Signed)
Subjective:    Patient ID: Savannah Myers, female    DOB: 04/08/1961, 57 y.o.   MRN: [id number] Chief Complaint  Patient presents with  . Follow-up    29month bil ven reflux   Patient presents for a 5673-month lower extremity edema follow-up.  Over the last 3 months, the patient has been engaging in conservative therapy including wearing medical grade 1 compression stockings, elevating her legs and remaining active with some improvement to her edema and discomfort.  [name] symptoms have improved "a little".  The patient underwent a bilateral venous duplex which was notable for no reflux in either the superficial or deep system.  No deep vein or superficial thrombophlebitis noted.  The patient continues to deny claudication-like symptoms rest pain or ulceration to the lower extremity.  The patient denies any fever, nausea vomiting.   Review of Systems  Constitutional: Negative.   HENT: Negative.   Eyes: Negative.   Respiratory: Negative.   Cardiovascular: Positive for leg swelling.       Varicose veins  Gastrointestinal: Negative.   Endocrine: Negative.   Genitourinary: Negative.   Musculoskeletal: Negative.   Skin: Negative.   Allergic/Immunologic: Negative.   Neurological: Negative.   Hematological: Negative.   Psychiatric/Behavioral: Negative.       Objective:   Physical Exam  Constitutional: She is oriented to person, place, and time. She appears well-developed and well-nourished. No distress.  HENT:  Head: Normocephalic and atraumatic.  Eyes: Conjunctivae are normal. Pupils are equal, round, and reactive to light.  Neck: Normal range of motion.  Cardiovascular: Normal rate, regular rhythm, normal heart sounds and intact distal pulses.  Pulses:      Radial pulses are 2+ on the right side, and 2+ on the left side.  Pulmonary/Chest: Effort normal and breath sounds normal.  Musculoskeletal: Normal range of motion. She exhibits no edema.  Neurological: She is alert  and oriented to person, place, and time.  Skin: She is not diaphoretic.  Less than 1 cm scattered varicose veins to the bilateral lower extremity.  There is no stasis dermatitis, ulcerations, cellulitis noted to the bilateral lower extremity  Psychiatric: She has a normal mood and affect. Her behavior is normal. Judgment and thought content normal.  Vitals reviewed.  BP 124/75 (BP Location: Right Arm)   Pulse 63   Resp 17   Wt 166 lb (75.3 kg)   LMP  (LMP Unknown)   BMI 32.42 kg/m   Past Medical History:  Diagnosis Date  . Depression   . Hypertension    Social History   Socioeconomic History  . Marital status: Married    Spouse name: Not on file  . Number of children: Not on file  . Years of education: Not on file  . Highest education level: Not on file  Social Needs  . Financial resource strain: Not on file  . Food insecurity - worry: Not on file  . Food insecurity - inability: Not on file  . Transportation needs - medical: Not on file  . Transportation needs - non-medical: Not on file  Occupational History  . Not on file  Tobacco Use  . Smoking status: Never Smoker  . Smokeless tobacco: Never Used  Substance and Sexual Activity  . Alcohol use: No  . Drug use: No  . Sexual activity: Not on file  Other Topics Concern  . Not on file  Social History Narrative  . Not on file   Past Surgical History:  Procedure  Laterality Date  . ABDOMINAL HYSTERECTOMY     Family History  Problem Relation Age of Onset  . Breast cancer Maternal Grandmother 52   No Known Allergies     Assessment & Plan:  Patient presents for a 81-month lower extremity edema follow-up.  Over the last 3 months, the patient has been engaging in conservative therapy including wearing medical grade 1 compression stockings, elevating her legs and remaining active with some improvement to her edema and discomfort.  [name] symptoms have improved "a little".  The patient underwent a bilateral venous  duplex which was notable for no reflux in either the superficial or deep system.  No deep vein or superficial thrombophlebitis noted.  The patient continues to deny claudication-like symptoms rest pain or ulceration to the lower extremity.  The patient denies any fever, nausea vomiting.  1. Bilateral lower extremity edema - Stable There is some improvement to the patient's bilateral lower extremity edema and discomfort to her varicosities engaging in conservative therapy over the last 3 months. The patient is not a candidate for endovenous laser ablation and she does not have any venous reflux located to the superficial venous system. We discussed applying for a lymphedema pump however at this time, the patient is not interested in pursuing this therapy. The patient is to continue engaging in conservative therapy if she should change her mind about the lymphedema pump she should call the office and I will be happy to apply for it The patient should elevate her legs heart level or higher The patient should remain active To follow-up as needed  2. Varicose veins of both lower extremities with pain - Stable As needed  Current Outpatient Medications on File Prior to Visit  Medication Sig Dispense Refill  . candesartan (ATACAND) 32 MG tablet Take 32 mg by mouth daily.    . Melatonin 5 MG TABS Take by mouth.    . meloxicam (MOBIC) 7.5 MG tablet Take 1-2 tablets daily as needed for pain    . naproxen (NAPROSYN) 500 MG tablet Take 1 tablet (500 mg total) by mouth 2 (two) times daily with a meal. 20 tablet 0  . pantoprazole (PROTONIX) 40 MG tablet Take 40 mg by mouth daily.      No current facility-administered medications on file prior to visit.    There are no Patient Instructions on file for this visit. No Follow-up on file.  Orin Eberwein A Annisten Manchester, PA-C

## 2017-09-06 DIAGNOSIS — H524 Presbyopia: Secondary | ICD-10-CM | POA: Diagnosis not present

## 2017-09-06 DIAGNOSIS — H5203 Hypermetropia, bilateral: Secondary | ICD-10-CM | POA: Diagnosis not present

## 2017-09-06 DIAGNOSIS — H52223 Regular astigmatism, bilateral: Secondary | ICD-10-CM | POA: Diagnosis not present

## 2017-10-22 DIAGNOSIS — R7303 Prediabetes: Secondary | ICD-10-CM | POA: Diagnosis not present

## 2017-10-22 DIAGNOSIS — E782 Mixed hyperlipidemia: Secondary | ICD-10-CM | POA: Diagnosis not present

## 2017-10-22 DIAGNOSIS — E7800 Pure hypercholesterolemia, unspecified: Secondary | ICD-10-CM | POA: Diagnosis not present

## 2017-10-22 DIAGNOSIS — I10 Essential (primary) hypertension: Secondary | ICD-10-CM | POA: Diagnosis not present

## 2017-10-29 ENCOUNTER — Other Ambulatory Visit: Payer: Self-pay | Admitting: Internal Medicine

## 2017-10-29 DIAGNOSIS — Z6832 Body mass index (BMI) 32.0-32.9, adult: Secondary | ICD-10-CM | POA: Insufficient documentation

## 2017-10-29 DIAGNOSIS — Z0000 Encounter for general adult medical examination without abnormal findings: Secondary | ICD-10-CM | POA: Diagnosis not present

## 2017-10-29 DIAGNOSIS — R7303 Prediabetes: Secondary | ICD-10-CM | POA: Diagnosis not present

## 2017-10-29 DIAGNOSIS — Z1231 Encounter for screening mammogram for malignant neoplasm of breast: Secondary | ICD-10-CM | POA: Diagnosis not present

## 2017-12-26 ENCOUNTER — Emergency Department: Payer: BLUE CROSS/BLUE SHIELD

## 2017-12-26 ENCOUNTER — Emergency Department
Admission: EM | Admit: 2017-12-26 | Discharge: 2017-12-26 | Disposition: A | Discharge status: Home / Self Care | Payer: BLUE CROSS/BLUE SHIELD | Attending: Emergency Medicine | Admitting: Emergency Medicine

## 2017-12-26 ENCOUNTER — Other Ambulatory Visit: Payer: Self-pay

## 2017-12-26 ENCOUNTER — Encounter: Payer: Self-pay | Admitting: Emergency Medicine

## 2017-12-26 DIAGNOSIS — R51 Headache: Secondary | ICD-10-CM | POA: Diagnosis not present

## 2017-12-26 DIAGNOSIS — R079 Chest pain, unspecified: Secondary | ICD-10-CM | POA: Diagnosis not present

## 2017-12-26 DIAGNOSIS — I10 Essential (primary) hypertension: Secondary | ICD-10-CM | POA: Diagnosis not present

## 2017-12-26 DIAGNOSIS — M542 Cervicalgia: Secondary | ICD-10-CM | POA: Diagnosis not present

## 2017-12-26 DIAGNOSIS — Z79899 Other long term (current) drug therapy: Secondary | ICD-10-CM | POA: Insufficient documentation

## 2017-12-26 DIAGNOSIS — R0789 Other chest pain: Secondary | ICD-10-CM | POA: Diagnosis not present

## 2017-12-26 DIAGNOSIS — R202 Paresthesia of skin: Secondary | ICD-10-CM | POA: Insufficient documentation

## 2017-12-26 LAB — CBC
HCT: 43.6 % (ref 35.0–47.0)
HEMOGLOBIN: 14.7 g/dL (ref 12.0–16.0)
MCH: 28.2 pg (ref 26.0–34.0)
MCHC: 33.8 g/dL (ref 32.0–36.0)
MCV: 83.6 fL (ref 80.0–100.0)
Platelets: 285 10*3/uL (ref 150–440)
RBC: 5.22 MIL/uL — ABNORMAL HIGH (ref 3.80–5.20)
RDW: 14.9 % — AB (ref 11.5–14.5)
WBC: 6.1 10*3/uL (ref 3.6–11.0)

## 2017-12-26 LAB — BASIC METABOLIC PANEL
ANION GAP: 7 (ref 5–15)
BUN: 16 mg/dL (ref 6–20)
CHLORIDE: 108 mmol/L (ref 98–111)
CO2: 28 mmol/L (ref 22–32)
Calcium: 10.3 mg/dL (ref 8.9–10.3)
Creatinine, Ser: 0.7 mg/dL (ref 0.44–1.00)
GFR calc Af Amer: >60 mL/min (ref >60)
GFR calc non Af Amer: >60 mL/min (ref >60)
GLUCOSE: 114 mg/dL — AB (ref 70–99)
POTASSIUM: 3.8 mmol/L (ref 3.5–5.1)
Sodium: 143 mmol/L (ref 135–145)

## 2017-12-26 LAB — TROPONIN I: Troponin I: <0.03 ng/mL (ref <0.03)
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
## 2017-12-26 NOTE — BH Assessment (Signed)
Per the request of ER MD (Dr. Darnelle CatalanMalinda) writer spoke with patient about the events that took place at her job. Writer process with the patient and provided her with outpatient information and instruction on how to follow up.

## 2017-12-26 NOTE — ED Triage Notes (Signed)
Per hospital interpretor, pt has bad headache, right arm numb for several days, is being treated for htn, took meds this am. Cp last night, speech clear at this time. Tearful in triage.

## 2017-12-26 NOTE — ED Notes (Signed)
Patient returned from radiology and placed back on cardiac monitor at this time.  Patient denies any needs at this moment.

## 2017-12-26 NOTE — Discharge Instructions (Addendum)
The numbness in your arm may come from a pinched nerve in your neck.  These follow-up with your regular doctor.  They can schedule an MRI if needed.  This may help to see if anything further needs to be done. For the chest pain please follow-up with cardiology Dr. Marina GoodellPerry shows is on call.  If you call his office first thing in the morning they should be to get you in probably as early as tomorrow just to double check and make sure everything is okay. Please return for any further problems.

## 2017-12-30 DIAGNOSIS — I493 Ventricular premature depolarization: Secondary | ICD-10-CM | POA: Diagnosis not present

## 2017-12-30 DIAGNOSIS — I10 Essential (primary) hypertension: Secondary | ICD-10-CM | POA: Diagnosis not present

## 2017-12-30 DIAGNOSIS — R002 Palpitations: Secondary | ICD-10-CM | POA: Diagnosis not present

## 2017-12-30 DIAGNOSIS — I83813 Varicose veins of bilateral lower extremities with pain: Secondary | ICD-10-CM | POA: Diagnosis not present

## 2018-02-17 ENCOUNTER — Ambulatory Visit
Admission: RE | Admit: 2018-02-17 | Discharge: 2018-02-17 | Disposition: A | Discharge status: Home / Self Care | Payer: BLUE CROSS/BLUE SHIELD | Source: Ambulatory Visit | Attending: Internal Medicine | Admitting: Internal Medicine

## 2018-02-17 DIAGNOSIS — Z1231 Encounter for screening mammogram for malignant neoplasm of breast: Secondary | ICD-10-CM | POA: Diagnosis not present

## 2018-06-17 DIAGNOSIS — J111 Influenza due to unidentified influenza virus with other respiratory manifestations: Secondary | ICD-10-CM | POA: Diagnosis not present

## 2018-07-07 DIAGNOSIS — M9903 Segmental and somatic dysfunction of lumbar region: Secondary | ICD-10-CM | POA: Diagnosis not present

## 2018-07-07 DIAGNOSIS — M25561 Pain in right knee: Secondary | ICD-10-CM | POA: Diagnosis not present

## 2018-07-07 DIAGNOSIS — M5416 Radiculopathy, lumbar region: Secondary | ICD-10-CM | POA: Diagnosis not present

## 2018-07-09 DIAGNOSIS — M9903 Segmental and somatic dysfunction of lumbar region: Secondary | ICD-10-CM | POA: Diagnosis not present

## 2018-07-09 DIAGNOSIS — M25561 Pain in right knee: Secondary | ICD-10-CM | POA: Diagnosis not present

## 2018-07-09 DIAGNOSIS — M5416 Radiculopathy, lumbar region: Secondary | ICD-10-CM | POA: Diagnosis not present

## 2018-07-11 DIAGNOSIS — M5416 Radiculopathy, lumbar region: Secondary | ICD-10-CM | POA: Diagnosis not present

## 2018-07-11 DIAGNOSIS — M25561 Pain in right knee: Secondary | ICD-10-CM | POA: Diagnosis not present

## 2018-07-11 DIAGNOSIS — M9903 Segmental and somatic dysfunction of lumbar region: Secondary | ICD-10-CM | POA: Diagnosis not present

## 2018-07-23 DIAGNOSIS — I10 Essential (primary) hypertension: Secondary | ICD-10-CM | POA: Diagnosis not present

## 2018-07-23 DIAGNOSIS — E782 Mixed hyperlipidemia: Secondary | ICD-10-CM | POA: Diagnosis not present

## 2018-07-23 DIAGNOSIS — I493 Ventricular premature depolarization: Secondary | ICD-10-CM | POA: Diagnosis not present

## 2018-09-30 DIAGNOSIS — H5203 Hypermetropia, bilateral: Secondary | ICD-10-CM | POA: Diagnosis not present

## 2018-09-30 DIAGNOSIS — H524 Presbyopia: Secondary | ICD-10-CM | POA: Diagnosis not present

## 2018-09-30 DIAGNOSIS — H52223 Regular astigmatism, bilateral: Secondary | ICD-10-CM | POA: Diagnosis not present

## 2018-10-04 ENCOUNTER — Inpatient Hospital Stay
Admission: EM | Admit: 2018-10-04 | Discharge: 2018-10-05 | DRG: 392 | Disposition: A | Discharge status: Home / Self Care | Payer: BLUE CROSS/BLUE SHIELD | Attending: Internal Medicine | Admitting: Internal Medicine

## 2018-10-04 ENCOUNTER — Other Ambulatory Visit: Payer: Self-pay

## 2018-10-04 ENCOUNTER — Emergency Department: Payer: BLUE CROSS/BLUE SHIELD

## 2018-10-04 DIAGNOSIS — R112 Nausea with vomiting, unspecified: Secondary | ICD-10-CM

## 2018-10-04 DIAGNOSIS — K529 Noninfective gastroenteritis and colitis, unspecified: Secondary | ICD-10-CM

## 2018-10-04 DIAGNOSIS — I10 Essential (primary) hypertension: Secondary | ICD-10-CM | POA: Diagnosis not present

## 2018-10-04 DIAGNOSIS — R1110 Vomiting, unspecified: Secondary | ICD-10-CM | POA: Diagnosis not present

## 2018-10-04 DIAGNOSIS — Z79899 Other long term (current) drug therapy: Secondary | ICD-10-CM

## 2018-10-04 DIAGNOSIS — N390 Urinary tract infection, site not specified: Secondary | ICD-10-CM | POA: Diagnosis not present

## 2018-10-04 DIAGNOSIS — E785 Hyperlipidemia, unspecified: Secondary | ICD-10-CM | POA: Diagnosis not present

## 2018-10-04 DIAGNOSIS — Z20828 Contact with and (suspected) exposure to other viral communicable diseases: Secondary | ICD-10-CM | POA: Diagnosis present

## 2018-10-04 DIAGNOSIS — E869 Volume depletion, unspecified: Secondary | ICD-10-CM

## 2018-10-04 DIAGNOSIS — E860 Dehydration: Secondary | ICD-10-CM | POA: Diagnosis not present

## 2018-10-04 DIAGNOSIS — R1033 Periumbilical pain: Secondary | ICD-10-CM | POA: Diagnosis not present

## 2018-10-04 DIAGNOSIS — Z79890 Hormone replacement therapy (postmenopausal): Secondary | ICD-10-CM | POA: Diagnosis not present

## 2018-10-04 DIAGNOSIS — Z90710 Acquired absence of both cervix and uterus: Secondary | ICD-10-CM | POA: Diagnosis not present

## 2018-10-04 DIAGNOSIS — A09 Infectious gastroenteritis and colitis, unspecified: Principal | ICD-10-CM | POA: Diagnosis present

## 2018-10-04 DIAGNOSIS — E875 Hyperkalemia: Secondary | ICD-10-CM | POA: Diagnosis not present

## 2018-10-04 DIAGNOSIS — Z791 Long term (current) use of non-steroidal anti-inflammatories (NSAID): Secondary | ICD-10-CM | POA: Diagnosis not present

## 2018-10-04 LAB — URINALYSIS, COMPLETE (UACMP) WITH MICROSCOPIC
Bacteria, UA: NONE SEEN
Bilirubin Urine: NEGATIVE
Glucose, UA: NEGATIVE mg/dL
Hgb urine dipstick: NEGATIVE
Ketones, ur: 5 mg/dL — AB
Nitrite: NEGATIVE
Protein, ur: >=300 mg/dL — AB
Specific Gravity, Urine: 1.022 (ref 1.005–1.030)
pH: 9 — ABNORMAL HIGH (ref 5.0–8.0)

## 2018-10-04 LAB — COMPREHENSIVE METABOLIC PANEL
ALT: 24 U/L (ref 0–44)
AST: 37 U/L (ref 15–41)
Albumin: 5.3 g/dL — ABNORMAL HIGH (ref 3.5–5.0)
Alkaline Phosphatase: 73 U/L (ref 38–126)
Anion gap: 14 (ref 5–15)
BUN: 20 mg/dL (ref 6–20)
CO2: 25 mmol/L (ref 22–32)
Calcium: 11.2 mg/dL — ABNORMAL HIGH (ref 8.9–10.3)
Chloride: 102 mmol/L (ref 98–111)
Creatinine, Ser: 0.82 mg/dL (ref 0.44–1.00)
GFR calc Af Amer: >60 mL/min (ref >60)
GFR calc non Af Amer: >60 mL/min (ref >60)
Glucose, Bld: 155 mg/dL — ABNORMAL HIGH (ref 70–99)
Potassium: 5.2 mmol/L — ABNORMAL HIGH (ref 3.5–5.1)
Sodium: 141 mmol/L (ref 135–145)
Total Bilirubin: 0.6 mg/dL (ref 0.3–1.2)
Total Protein: 8.6 g/dL — ABNORMAL HIGH (ref 6.5–8.1)

## 2018-10-04 LAB — CBC
HCT: 48.7 % — ABNORMAL HIGH (ref 36.0–46.0)
Hemoglobin: 16.1 g/dL — ABNORMAL HIGH (ref 12.0–15.0)
MCH: 28 pg (ref 26.0–34.0)
MCHC: 33.1 g/dL (ref 30.0–36.0)
MCV: 84.5 fL (ref 80.0–100.0)
Platelets: 347 10*3/uL (ref 150–400)
RBC: 5.76 MIL/uL — ABNORMAL HIGH (ref 3.87–5.11)
RDW: 13.5 % (ref 11.5–15.5)
WBC: 16.1 10*3/uL — ABNORMAL HIGH (ref 4.0–10.5)
nRBC: 0 % (ref 0.0–0.2)

## 2018-10-04 LAB — SARS CORONAVIRUS 2 BY RT PCR (HOSPITAL ORDER, PERFORMED IN ~~LOC~~ HOSPITAL LAB): SARS Coronavirus 2: NEGATIVE

## 2018-10-04 LAB — TROPONIN I: Troponin I: <0.03 ng/mL (ref <0.03)

## 2018-10-04 LAB — POCT PREGNANCY, URINE: Preg Test, Ur: NEGATIVE

## 2018-10-04 LAB — LIPASE, BLOOD: Lipase: 23 U/L (ref 11–51)

## 2018-10-04 MED ORDER — ENOXAPARIN SODIUM 40 MG/0.4ML ~~LOC~~ SOLN
40.0000 mg | SUBCUTANEOUS | Status: DC
  Administered 2018-10-04: 40 mg via SUBCUTANEOUS
  Filled 2018-10-04: qty 0.4

## 2018-10-04 MED ORDER — HYDRALAZINE HCL 20 MG/ML IJ SOLN: 5.0000 mg | INTRAMUSCULAR | Status: DC | PRN

## 2018-10-04 MED ORDER — ONDANSETRON HCL 4 MG PO TABS: 4.0000 mg | ORAL_TABLET | Freq: Four times a day (QID) | ORAL | Status: DC | PRN

## 2018-10-04 MED ORDER — POLYETHYLENE GLYCOL 3350 17 G PO PACK: 17.0000 g | PACK | Freq: Every day | ORAL | Status: DC | PRN

## 2018-10-04 MED ORDER — METRONIDAZOLE IN NACL 5-0.79 MG/ML-% IV SOLN
500.0000 mg | Freq: Three times a day (TID) | INTRAVENOUS | Status: DC
  Administered 2018-10-04 – 2018-10-05 (×4): 500 mg via INTRAVENOUS
  Filled 2018-10-04 (×7): qty 100

## 2018-10-04 MED ORDER — ACETAMINOPHEN 650 MG RE SUPP: 650.0000 mg | Freq: Four times a day (QID) | RECTAL | Status: DC | PRN

## 2018-10-04 MED ORDER — ACETAMINOPHEN 325 MG PO TABS: 650.0000 mg | ORAL_TABLET | Freq: Four times a day (QID) | ORAL | Status: DC | PRN

## 2018-10-04 MED ORDER — ONDANSETRON HCL 4 MG/2ML IJ SOLN: 4.0000 mg | Freq: Four times a day (QID) | INTRAMUSCULAR | Status: DC | PRN

## 2018-10-04 MED ORDER — ONDANSETRON HCL 4 MG/2ML IJ SOLN
4.0000 mg | INTRAMUSCULAR | Status: AC
  Administered 2018-10-04: 04:00:00 4 mg via INTRAVENOUS
  Filled 2018-10-04: qty 2

## 2018-10-04 MED ORDER — CIPROFLOXACIN IN D5W 400 MG/200ML IV SOLN
400.0000 mg | Freq: Two times a day (BID) | INTRAVENOUS | Status: DC
  Administered 2018-10-04 – 2018-10-05 (×2): 400 mg via INTRAVENOUS
  Filled 2018-10-04 (×4): qty 200

## 2018-10-04 MED ORDER — SODIUM CHLORIDE 0.9% FLUSH: 3.0000 mL | Freq: Once | INTRAVENOUS | Status: DC

## 2018-10-04 MED ORDER — IOHEXOL 300 MG/ML  SOLN
100.0000 mL | Freq: Once | INTRAMUSCULAR | Status: AC | PRN
  Administered 2018-10-04: 04:00:00 100 mL via INTRAVENOUS

## 2018-10-04 MED ORDER — MORPHINE SULFATE (PF) 4 MG/ML IV SOLN
4.0000 mg | Freq: Once | INTRAVENOUS | Status: AC
  Administered 2018-10-04: 04:00:00 4 mg via INTRAVENOUS
  Filled 2018-10-04: qty 1

## 2018-10-04 MED ORDER — SODIUM CHLORIDE 0.9 % IV SOLN
INTRAVENOUS | Status: DC
  Administered 2018-10-04 – 2018-10-05 (×2): via INTRAVENOUS

## 2018-10-04 MED ORDER — CIPROFLOXACIN IN D5W 400 MG/200ML IV SOLN
400.0000 mg | Freq: Two times a day (BID) | INTRAVENOUS | Status: DC
  Administered 2018-10-04: 11:00:00 400 mg via INTRAVENOUS
  Filled 2018-10-04 (×2): qty 200

## 2018-10-04 NOTE — ED Notes (Signed)
Patient transported to CT 

## 2018-10-04 NOTE — ED Notes (Signed)
Pt's son called with this RN's phone

## 2018-10-04 NOTE — ED Triage Notes (Signed)
Patient c/o abdominal pain, emesis. Denies diarrhea.

## 2018-10-04 NOTE — H&P (Addendum)
[facility] [place] [facility]   PATIENT NAME: Savannah Myers    MR#:  [id number]  DATE OF BIRTH:  Aug 20, 1960  DATE OF ADMISSION:  10/04/2018  PRIMARY CARE PHYSICIAN: Leotis Shames, MD   REQUESTING/REFERRING PHYSICIAN: Loleta Rose, MD  CHIEF COMPLAINT:   Chief Complaint  Patient presents with  . Abdominal Pain    HISTORY OF PRESENT ILLNESS:  Savannah Myers  is a 58 y.o. female with a known history of hypertension and depression who presented to the ED with abdominal pain over the last 2 days.  The pain is located directly above her bellybutton.  She describes the pain as "sharp".  She is also had multiple episodes of vomiting.  She denies any hematemesis or hematochezia.  She has been having normal bowel movements.  No diarrhea or constipation.  She denies any fevers or chills.  She denies any sick contacts or travel recently.  In the ED, vitals were unremarkable.  Labs are significant for K 5.2, WBC 16.1, hemoglobin 16.1.  UA with large leukocytes, >300 protein, no bacteria.  CT abdomen pelvis with possible enteritis.  GI was consulted and recommended Cipro and Flagyl, no steroids, liquid diet.  Hospitalists were called for admission.  PAST MEDICAL HISTORY:   Past Medical History:  Diagnosis Date  . Depression   . Hypertension     PAST SURGICAL HISTORY:   Past Surgical History:  Procedure Laterality Date  . ABDOMINAL HYSTERECTOMY      SOCIAL HISTORY:   Social History   Tobacco Use  . Smoking status: Never Smoker  . Smokeless tobacco: Never Used  Substance Use Topics  . Alcohol use: No    FAMILY HISTORY:   Family History  Problem Relation Age of Onset  . Breast cancer Maternal Grandmother 64    DRUG ALLERGIES:  No Known Allergies  REVIEW OF SYSTEMS:   Review of Systems  Constitutional: Negative for chills and fever.  HENT: Negative for congestion and sore throat.   Eyes: Negative for blurred vision and double vision.   Respiratory: Negative for cough and shortness of breath.   Cardiovascular: Negative for chest pain and palpitations.  Gastrointestinal: Positive for abdominal pain, nausea and vomiting. Negative for blood in stool, constipation, diarrhea and melena.  Genitourinary: Negative for dysuria, frequency and urgency.  Musculoskeletal: Negative for back pain and neck pain.  Neurological: Negative for dizziness and headaches.  Psychiatric/Behavioral: Negative for depression. The patient is not nervous/anxious.     MEDICATIONS AT HOME:   Prior to Admission medications   Medication Sig Start Date End Date Taking? Authorizing Provider  candesartan (ATACAND) 32 MG tablet Take 32 mg by mouth daily.   Yes [provider]  rosuvastatin (CRESTOR) 20 MG tablet Take 20 mg by mouth daily.   Yes [provider]  Melatonin 5 MG TABS Take 5 mg by mouth at bedtime.     [provider]  meloxicam (MOBIC) 7.5 MG tablet Take 7.5-15 mg by mouth daily as needed for pain.     [provider]  naproxen (NAPROSYN) 500 MG tablet Take 1 tablet (500 mg total) by mouth 2 (two) times daily with a meal. Patient not taking: Reported on 12/26/2017 03/16/17   Sharman Cheek, MD  pantoprazole (PROTONIX) 40 MG tablet Take 40 mg by mouth daily.     [provider]  telmisartan (MICARDIS) 40 MG tablet Take 40 mg by mouth daily. 07/23/18   [provider]  VITAL SIGNS:  Blood pressure 126/71, pulse (!) 57, temperature 97.8 F (36.6 C), resp. rate 16, weight 72 kg, SpO2 100 %.  PHYSICAL EXAMINATION:  Physical Exam  GENERAL:  58 y.o.-year-old patient lying in the bed with no acute distress.  EYES: Pupils equal, round, reactive to light and accommodation. No scleral icterus. Extraocular muscles intact.  HEENT: Head atraumatic, normocephalic. Oropharynx and nasopharynx clear.  NECK:  Supple, no jugular venous distention. No thyroid enlargement, no tenderness.  LUNGS: Normal  breath sounds bilaterally, no wheezing, rales,rhonchi or crepitation. No use of accessory muscles of respiration.  CARDIOVASCULAR: RRR, S1, S2 normal. No murmurs, rubs, or gallops.  ABDOMEN: Soft, nontender, nondistended. Bowel sounds present. No organomegaly or mass.  EXTREMITIES: No pedal edema, cyanosis, or clubbing.  NEUROLOGIC: Cranial nerves II through XII are intact. Muscle strength 5/5 in all extremities. Sensation intact. Gait not checked.  PSYCHIATRIC: The patient is alert and oriented x 3.  SKIN: No obvious rash, lesion, or ulcer.   LABORATORY PANEL:   CBC Recent Labs  Lab 10/04/18 0042  WBC 16.1*  HGB 16.1*  HCT 48.7*  PLT 347   ------------------------------------------------------------------------------------------------------------------  Chemistries  Recent Labs  Lab 10/04/18 0042  NA 141  K 5.2*  CL 102  CO2 25  GLUCOSE 155*  BUN 20  CREATININE 0.82  CALCIUM 11.2*  AST 37  ALT 24  ALKPHOS 73  BILITOT 0.6   ------------------------------------------------------------------------------------------------------------------  Cardiac Enzymes Recent Labs  Lab 10/04/18 0042  TROPONINI <0.03   ------------------------------------------------------------------------------------------------------------------  RADIOLOGY:  Ct Abdomen Pelvis W Contrast  Result Date: 10/04/2018 CLINICAL DATA:  58 year old female with history of nausea and vomiting with intermittent but sudden onset abdominal pain centered at the umbilicus. EXAM: CT ABDOMEN AND PELVIS WITH CONTRAST TECHNIQUE: Multidetector CT imaging of the abdomen and pelvis was performed using the standard protocol following bolus administration of intravenous contrast. CONTRAST:  100mL OMNIPAQUE IOHEXOL 300 MG/ML  SOLN COMPARISON:  No priors. FINDINGS: Lower chest: Unremarkable. Hepatobiliary: No suspicious cystic or solid hepatic lesions. No intra or extrahepatic biliary ductal dilatation. Gallbladder is  normal in appearance. Pancreas: No pancreatic mass. No pancreatic ductal dilatation. No pancreatic or peripancreatic fluid or inflammatory changes. Spleen: Unremarkable. Adrenals/Urinary Tract: Subcentimeter low-attenuation lesion in the upper pole the left kidney, too small to characterize, but statistically likely a cyst. Right kidney and bilateral adrenal glands are normal in appearance. No hydroureteronephrosis. Urinary bladder is normal in appearance. Stomach/Bowel: Normal appearance of the stomach. Normal appendix. Several mildly prominent but nondilated loops of fluid-filled small bowel are noted, nonspecific. Relative narrowing of the distal and terminal ileum which demonstrates mural thickening and mucosal hyperenhancement without overt surrounding inflammatory changes. Several other short segment areas of narrowing of the small bowel lumen with associated mural thickening and mucosal hyperenhancement are also noted elsewhere. Normal appendix. Vascular/Lymphatic: No significant atherosclerotic disease, aneurysm or dissection noted in the abdominal or pelvic vasculature. No lymphadenopathy noted in the abdomen or pelvis. Reproductive: Status post hysterectomy. Ovaries are not confidently identified may be surgically absent or atrophic. Other: No significant volume of ascites.  No pneumoperitoneum. Musculoskeletal: There are no aggressive appearing lytic or blastic lesions noted in the visualized portions of the skeleton. IMPRESSION: 1. There is a spectrum of findings in the abdomen concerning for potential enteritis such as Crohn's disease, as detailed above. 2. Normal appendix. Electronically Signed   By: Trudie Reedaniel  Entrikin M.D.   On: 10/04/2018 04:13      IMPRESSION AND PLAN:   Enteritis- likely due  to infectious process versus inflammatory bowel disease, although new diagnosis of inflammatory bowel disease is less likely in a 58 year old. -GI consulted -Start empiric ciprofloxacin and Flagyl  -Clear liquid diet -GI pathogen panel to rule out infectious cause -IVFs  Possible UTI- patient not having any urinary symptoms. -On ciprofloxacin for enteritis -Urine culture pending  Hyperkalemia- K 5.2 -Hold home ACE -Recheck K tomorrow  Hypertension- BPs controlled -Holding home ACE due to hyperkalemia -Hydralazine IV prn  All the records are reviewed and case discussed with ED provider. Management plans discussed with the patient, family and they are in agreement.  CODE STATUS: Full  TOTAL TIME TAKING CARE OF THIS PATIENT: 45 minutes.    Jinny Blossom Cassiel Fernandez M.D on 10/04/2018 at 5:45 AM  Between 7am to 6pm - Pager - [phone]  After 6pm go to [email or web address] - Social research officer, government  [facility] [place] [facility]  Office  [phone]  CC: Primary care physician; Leotis Shames, MD   Note: This dictation was prepared with Dragon dictation along with smaller phrase technology. Any transcriptional errors that result from this process are unintentional.

## 2018-10-04 NOTE — ED Notes (Signed)
ED Provider at bedside. Pt complains of nausea, vomiting x 2,  and abdominal pain (sudden onset, intermittent pain centered at umbilicus)    Pt reports 2 x "notmal" BM today, gallbladder removal, and hysterectomy

## 2018-10-04 NOTE — Progress Notes (Signed)
Pharmacy Antibiotic Note  Savannah Myers is a 58 y.o. female admitted on 10/04/2018 with intra-abdominal infx.  Pharmacy has been consulted for ciprofloxacin dosing. Patient has CT evidence of an inflammatory process such as enteritis. Patient does not have a current EKG to assess QTc; however, patient doesn't have a high risk considering she's not on any QTc-prolonging medications and no hx of heart issues.   Plan: Will start patient on ciprofloxacin 400 mg IV q12h  Weight: 158 lb 11.7 oz (72 kg)  Temp (24hrs), Avg:97.8 F (36.6 C), Min:97.8 F (36.6 C), Max:97.8 F (36.6 C)  Recent Labs  Lab 10/04/18 0042  WBC 16.1*  CREATININE 0.82    CrCl cannot be calculated (Unknown ideal weight.).    No Known Allergies  Thank you for allowing pharmacy to be a part of this patient's care.  Thomasene Ripple, PharmD, BCPS Clinical Pharmacist 10/04/2018

## 2018-10-04 NOTE — ED Provider Notes (Signed)
[facility] Emergency Department Provider Note  ____________________________________________   First MD Initiated Contact with Patient 10/04/18 [phone]     (approximate)  I have reviewed the triage vital signs and the nursing notes.   HISTORY  Chief Complaint Abdominal Pain    HPI Savannah Myers is a 58 y.o. female with medical and surgical history as listed below who presents for evaluation of gradually worsening middle abdominal pain over the course of the day.  She says that it started this morning and is gradually gotten worse and is now severe, sharp and aching.  She has had persistent nausea with 2 episodes of emesis and no appetite since lunch.  The pain is just above her bellybutton.  Nothing in particular makes it better or worse although moving around seems to make it a little bit more comfortable.  She denies fever/chills, sore throat, cough, shortness of breath, chest pain, and dysuria.  She has not had these symptoms in the past.  She knows that she has had a hysterectomy but is not sure if she has her gallbladder out.  She is confident that she has not had her appendix removed.  She had 2 normal bowel movements earlier today and reports neither diarrhea nor constipation.  She has not been around anybody who is tested positive for COVID-19.           Past Medical History:  Diagnosis Date   Depression    Hypertension     Patient Active Problem List   Diagnosis Date Noted   Enteritis 10/04/2018   Varicose veins of both lower extremities with pain 03/06/2017   Hyperlipidemia 03/06/2017   Bilateral lower extremity edema 03/06/2017    Past Surgical History:  Procedure Laterality Date   ABDOMINAL HYSTERECTOMY      Prior to Admission medications   Medication Sig Start Date End Date Taking? Authorizing Provider  telmisartan (MICARDIS) 40 MG tablet Take 40 mg by mouth daily. 07/23/18  Yes [provider]  naproxen (NAPROSYN)  500 MG tablet Take 1 tablet (500 mg total) by mouth 2 (two) times daily with a meal. Patient not taking: Reported on 12/26/2017 03/16/17   Sharman CheekStafford, Phillip, MD    Allergies Patient has no known allergies.  Family History  Problem Relation Age of Onset   Breast cancer Maternal Grandmother [id number]    Social History Social History   Tobacco Use   Smoking status: Never Smoker   Smokeless tobacco: Never Used  Substance Use Topics   Alcohol use: No   Drug use: No    Review of Systems Constitutional: No fever/chills Eyes: No visual changes. ENT: No sore throat. Cardiovascular: Denies chest pain. Respiratory: Denies shortness of breath. Gastrointestinal: Periumbilical abdominal pain with nausea and 2 episodes of vomiting, gradually worsening throughout the day.  No diarrhea nor constipation. Genitourinary: Negative for dysuria. Musculoskeletal: Negative for neck pain.  Negative for back pain. Integumentary: Negative for rash. Neurological: Negative for headaches, focal weakness or numbness.   ____________________________________________   PHYSICAL EXAM:  VITAL SIGNS: ED Triage Vitals  Enc Vitals Group     BP 10/04/18 0035 93/60     Pulse Rate 10/04/18 0035 99     Resp 10/04/18 0035 (!) 22     Temp 10/04/18 0035 97.8 F (36.6 C)     Temp src --      SpO2 10/04/18 0035 100 %     Weight 10/04/18 0033 72 kg (158 lb 11.7 oz)  Height --      Head Circumference --      Peak Flow --      Pain Score 10/04/18 0033 10     Pain Loc --      Pain Edu? --      Excl. in GC? --     Constitutional: Alert and oriented.  Nontoxic but appears acutely uncomfortable. Eyes: Conjunctivae are normal.  Head: Atraumatic. Nose: No congestion/rhinnorhea. Mouth/Throat: Mucous membranes are moist. Neck: No stridor.  No meningeal signs.   Cardiovascular: Normal rate, regular rhythm. Good peripheral circulation. Grossly normal heart sounds. Respiratory: Normal respiratory effort.  No  retractions. No audible wheezing. Gastrointestinal: Soft and nondistended.  No tenderness in McBurney's point, but the patient guarded and gasped when I palpated just above her umbilicus.  She has some epigastric tenderness to palpation but no right upper quadrant tenderness and a negative Murphy sign. Musculoskeletal: No lower extremity tenderness nor edema. No gross deformities of extremities. Neurologic:  Normal speech and language. No gross focal neurologic deficits are appreciated.  Skin:  Skin is warm, dry and intact. No rash noted.   ____________________________________________   LABS (all labs ordered are listed, but only abnormal results are displayed)  Labs Reviewed  COMPREHENSIVE METABOLIC PANEL - Abnormal; Notable for the following components:      Result Value   Potassium 5.2 (*)    Glucose, Bld 155 (*)    Calcium 11.2 (*)    Total Protein 8.6 (*)    Albumin 5.3 (*)    All other components within normal limits  CBC - Abnormal; Notable for the following components:   WBC 16.1 (*)    RBC 5.76 (*)    Hemoglobin 16.1 (*)    HCT 48.7 (*)    All other components within normal limits  URINALYSIS, COMPLETE (UACMP) WITH MICROSCOPIC - Abnormal; Notable for the following components:   Color, Urine YELLOW (*)    APPearance CLOUDY (*)    pH 9.0 (*)    Ketones, ur 5 (*)    Protein, ur >=300 (*)    Leukocytes,Ua LARGE (*)    All other components within normal limits  SARS CORONAVIRUS 2 (HOSPITAL ORDER, PERFORMED IN [facility])  URINE CULTURE  LIPASE, BLOOD  TROPONIN I  POC URINE PREG, ED  POCT PREGNANCY, URINE   ____________________________________________  EKG  No indication for EKG ____________________________________________  RADIOLOGY   ED MD interpretation: Enteritis with some terminal ileum thickening and narrowing concerning for possible inflammatory bowel disease.  Official radiology report(s): Ct Abdomen Pelvis W Contrast  Result Date:  10/04/2018 CLINICAL DATA:  58 year old female with history of nausea and vomiting with intermittent but sudden onset abdominal pain centered at the umbilicus. EXAM: CT ABDOMEN AND PELVIS WITH CONTRAST TECHNIQUE: Multidetector CT imaging of the abdomen and pelvis was performed using the standard protocol following bolus administration of intravenous contrast. CONTRAST:  OMNIPAQUE IOHEXOL 300 MG/ML  SOLN COMPARISON:  No priors. FINDINGS: Lower chest: Unremarkable. Hepatobiliary: No suspicious cystic or solid hepatic lesions. No intra or extrahepatic biliary ductal dilatation. Gallbladder is normal in appearance. Pancreas: No pancreatic mass. No pancreatic ductal dilatation. No pancreatic or peripancreatic fluid or inflammatory changes. Spleen: Unremarkable. Adrenals/Urinary Tract: Subcentimeter low-attenuation lesion in the upper pole the left kidney, too small to characterize, but statistically likely a cyst. Right kidney and bilateral adrenal glands are normal in appearance. No hydroureteronephrosis. Urinary bladder is normal in appearance. Stomach/Bowel: Normal appearance of the stomach. Normal appendix. Several  mildly prominent but nondilated loops of fluid-filled small bowel are noted, nonspecific. Relative narrowing of the distal and terminal ileum which demonstrates mural thickening and mucosal hyperenhancement without overt surrounding inflammatory changes. Several other short segment areas of narrowing of the small bowel lumen with associated mural thickening and mucosal hyperenhancement are also noted elsewhere. Normal appendix. Vascular/Lymphatic: No significant atherosclerotic disease, aneurysm or dissection noted in the abdominal or pelvic vasculature. No lymphadenopathy noted in the abdomen or pelvis. Reproductive: Status post hysterectomy. Ovaries are not confidently identified may be surgically absent or atrophic. Other: No significant volume of ascites.  No pneumoperitoneum. Musculoskeletal:  There are no aggressive appearing lytic or blastic lesions noted in the visualized portions of the skeleton. IMPRESSION: 1. There is a spectrum of findings in the abdomen concerning for potential enteritis such as Crohn's disease, as detailed above. 2. Normal appendix. Electronically Signed   By: Trudie Reed M.D.   On: 10/04/2018 04:13    ____________________________________________   PROCEDURES   Procedure(s) performed (including Critical Care):  Procedures   ____________________________________________   INITIAL IMPRESSION / MDM / ASSESSMENT AND PLAN / ED COURSE  As part of my medical decision making, I reviewed the following data within the electronic MEDICAL RECORD NUMBER Nursing notes reviewed and incorporated, Labs reviewed , Old chart reviewed, Discussed with gastroenterology (Dr. Allegra Lai), Discussed with admitting physician (Dr. Nancy Marus), Notes from prior ED visits and [place] Controlled Substance Database      *Savannah Myers was evaluated in Emergency Department on 10/04/2018 for the symptoms described in the history of present illness. She was evaluated in the context of the global COVID-19 pandemic, which necessitated consideration that the patient might be at risk for infection with the SARS-CoV-2 virus that causes COVID-19. Institutional protocols and algorithms that pertain to the evaluation of patients at risk for COVID-19 are in a state of rapid change based on information released by regulatory bodies including the CDC and federal and state organizations. These policies and algorithms were followed during the patient's care in the ED.  Some ED evaluations and interventions may be delayed as a result of limited staffing during the pandemic.*  Differential diagnosis includes, but is not limited to, appendicitis, biliary disease, diverticulitis, SBO/ileus, nonspecific gastritis.  [name] vital signs are generally reassuring although she is borderline tachycardic.  Her blood  pressure is a little bit on the low side but this may be normal for her.  She has a leukocytosis of 16.1 and a hemoglobin of 16.1 and is likely volume depleted so give her a liter of IV fluids.  Her potassium is slightly elevated at 5.2 which again is likely a volume issue and should correct with some IV fluids.  Lipase is normal.  I am adding on a troponin but I think ACS and/or aortic dissection is very unlikely.  I explained to the patient that we need to rule out appendicitis and diverticulitis.  Since she is not certain if she has had her gallbladder removed I think that obtaining an ultrasound initially is unlikely to be diagnostic, particular since most of the pain is periumbilical.  She was initially concerned about the cost of the study but I explained that I cannot rule out an acute or emergent medical condition without the imaging particularly based on some of her lab abnormalities.  She understands and agrees with proceeding with the CT of the abdomen and pelvis with IV contrast only (since she cannot tolerate liquid by mouth at this time).  I am giving her morphine 4 mg IV and Zofran 4 mg IV for pain and nausea.  Clinical Course as of Oct 03 913  Sat Oct 04, 2018  1610 Troponin I: <0.03 [CF]  9604 Paged Dr. Allegra Lai with GI.   [CF]  [phone] CT scan of the abdomen and pelvis is notable for what appears to be enteritis concerning for inflammatory bowel disease.  I updated the patient about the results.  I offered to admit her to the hospital but she does not want to stay in the hospital.  I have paged gastroenterology to discuss the results and to ask about any specific management recommendations (such as antibiotics and/or steroids).  I have already stressed to the patient how important it is that she follow-up as an outpatient with GI.  CT ABDOMEN PELVIS W CONTRAST [CF]  0600 (documentation delayed due to a critical patient in the ED requiring extensive time and care)  I spoke by phone with Dr.  Allegra Lai with gastroenterology.  We discussed the case in some detail and she recommends admitting the patient to the hospital for further evaluation, particularly given the somewhat unusual presentation at her age of IBD, the fact that it is unclear whether or not the patient should be on steroids at this time, her volume depletion based on the hyperkalemia and her elevated hemoglobin, and apparent urinary tract infection with extensive proteinuria, her leukocytosis, and her persistent nausea and pain even though she claims that she is feeling a little bit better.  She recommended that with Cipro and Flagyl for the enteritis and she will consult on the patient.  I explained these recommendations to the patient and she agreed that she will stay in the hospital.  I discussed the case in person with Dr. Nancy Marus with the hospitalist service who will admit.  COVID-19 test was negative.   [CF]    Clinical Course User Index [CF] Loleta Rose, MD      ____________________________________________  FINAL CLINICAL IMPRESSION(S) / ED DIAGNOSES  Final diagnoses:  Enteritis  Volume depletion  Periumbilical abdominal pain  Non-intractable vomiting with nausea, unspecified vomiting type  Urinary tract infection without hematuria, site unspecified     MEDICATIONS GIVEN DURING THIS VISIT:  Medications  sodium chloride flush (NS) 0.9 % injection 3 mL (3 mLs Intravenous Not Given 10/04/18 0306)  ciprofloxacin (CIPRO) IVPB 400 mg (has no administration in time range)  morphine 4 MG/ML injection 4 mg (4 mg Intravenous Given 10/04/18 0344)  ondansetron (ZOFRAN) injection 4 mg (4 mg Intravenous Given 10/04/18 0342)  iohexol (OMNIPAQUE) 300 MG/ML solution 100 mL (100 mLs Intravenous Contrast Given 10/04/18 0354)     ED Discharge Orders    None       Note:  This document was prepared using Dragon voice recognition software and may include unintentional dictation errors.   Loleta Rose, MD 10/04/18 [phone]

## 2018-10-04 NOTE — Consult Note (Signed)
Savannah Darby, MD [address]  [place]  [name], [name] [address]  Main: [phone]  Fax: [phone] Pager: [phone]   Consultation  Referring Provider:     No ref. provider found Primary Care Physician:  Glendon Axe, MD Primary Gastroenterologist:    None       Reason for Consultation: Enteritis  Date of Admission:  10/04/2018 Date of Consultation:  10/04/2018         HPI:   Savannah Myers is a 58 y.o. 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  She reports that her symptoms started earlier this week, initially had severe nausea associated with vomiting which started on Tuesday this week.  She tells me that she mostly had homemade food as well as papaya on Friday that was cooked by her daughter at home.  She visited [place] in December 2019.  Her vomiting subsided however periumbilical pain and nausea persisted which brought her to the ER.  In the ER, she had leukocytosis, WBC 16.1, dehydrated, normal lipase.  She underwent CT abdomen and pelvis which revealed Several mildly prominent but nondilated loops of fluid-filled small bowel are noted, nonspecific. Relative narrowing of the distal and terminal ileum which demonstrates mural thickening and mucosal hyperenhancement without overt surrounding inflammatory changes. Several other short segment areas of narrowing of the small bowel lumen with associated mural thickening and mucosal hyperenhancement are also noted elsewhere. Normal appendix.  When the ER physician called me, I recommended empiric antibiotics only and admitted under observation, IV hydration.  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  She is  worried that she might have to go back to her country permanently.  Her daughter lives here who is a citizen.  Today, when I interviewed the patient she reports that she is feeling significantly better.  Able to tolerate clear liquids.  She denies abdominal pain, nausea or vomiting.  Unable to draw labs today Patient does not smoke or drink alcohol  NSAIDs: None  Antiplts/Anticoagulants/Anti thrombotics: None  GI Procedures: Reports having had a colonoscopy at [facility] about 3 to 4 years ago and reportedly normal She denies family history of GI malignancy  Past Medical History:  Diagnosis Date  . Depression   . Hypertension     Past Surgical History:  Procedure Laterality Date  . ABDOMINAL HYSTERECTOMY      Prior to Admission medications   Medication Sig Start Date End Date Taking? Authorizing Provider  telmisartan (MICARDIS) 40 MG tablet Take 40 mg by mouth daily. 07/23/18  Yes [provider]  naproxen (NAPROSYN) 500 MG tablet Take 1 tablet (500 mg total) by mouth 2 (two) times daily with a meal. Patient not taking: Reported on 12/26/2017 03/16/17   Carrie Mew, MD   Current Facility-Administered Medications:  .  0.9 %  sodium chloride infusion, , Intravenous, Continuous, Mayo, Pete Pelt, MD, Last Rate: 100 mL/hr at 10/04/18 1300 .  acetaminophen (TYLENOL) tablet 650 mg, 650 mg, Oral, Q6H PRN **OR** acetaminophen (TYLENOL) suppository 650 mg, 650 mg, Rectal, Q6H PRN, Mayo, Pete Pelt, MD .  ciprofloxacin (CIPRO) IVPB 400 mg, 400 mg, Intravenous, Q12H, Charlett Nose, RPH .  enoxaparin (LOVENOX) injection 40 mg, 40  mg, Subcutaneous, Q24H, Mayo, Pete Pelt, MD .  hydrALAZINE (APRESOLINE) injection 5 mg, 5 mg, Intravenous, Q4H PRN, Mayo, Pete Pelt, MD .  metroNIDAZOLE (FLAGYL) IVPB 500 mg, 500 mg, Intravenous, Q8H, Mayo, Pete Pelt, MD, Stopped at 10/04/18 1108 .  ondansetron (ZOFRAN) tablet 4 mg, 4 mg, Oral, Q6H PRN **OR** ondansetron (ZOFRAN) injection 4 mg, 4 mg,  Intravenous, Q6H PRN, Mayo, Pete Pelt, MD .  polyethylene glycol (MIRALAX / GLYCOLAX) packet 17 g, 17 g, Oral, Daily PRN, Mayo, Pete Pelt, MD .  sodium chloride flush (NS) 0.9 % injection 3 mL, 3 mL, Intravenous, Once, Hinda Kehr, MD   Family History  Problem Relation Age of Onset  . Breast cancer Maternal Grandmother 46     Social History   Tobacco Use  . Smoking status: Never Smoker  . Smokeless tobacco: Never Used  Substance Use Topics  . Alcohol use: No  . Drug use: No    Allergies as of 10/04/2018  . (No Known Allergies)    Review of Systems:    All systems reviewed and negative except where noted in HPI.   Physical Exam:  Vital signs in last 24 hours: Temp:  [97.8 F (36.6 C)-98.6 F (37 C)] 98.1 F (36.7 C) (05/23 1520) Pulse Rate:  [51-99] 62 (05/23 1520) Resp:  [15-22] 15 (05/23 1520) BP: (93-140)/(54-81) 101/56 (05/23 1520) SpO2:  [96 %-100 %] 100 % (05/23 1520) Weight:  [68.8 kg-72 kg] 68.8 kg (05/23 0828) Last BM Date: 10/03/18(instructed to save stool sample) General:   Pleasant, cooperative in NAD Head:  Normocephalic and atraumatic. Eyes:   No icterus.   Conjunctiva pink. PERRLA. Ears:  Normal auditory acuity. Neck:  Supple; no masses or thyroidomegaly Lungs: Respirations even and unlabored. Lungs clear to auscultation bilaterally.   No wheezes, crackles, or rhonchi.  Heart:  Regular rate and rhythm;  Without murmur, clicks, rubs or gallops Abdomen:  Soft, nondistended, nontender. Normal bowel sounds. No appreciable masses or hepatomegaly.  No rebound or guarding.  Rectal:  Not performed. Msk:  Symmetrical without gross deformities.  Strength normal Extremities:  Without edema, cyanosis or clubbing. Neurologic:  Alert and oriented x3;  grossly normal neurologically. Skin:  Intact without significant lesions or rashes. Psych:  Alert and cooperative. Normal affect.  LAB RESULTS: CBC Latest Ref Rng & Units 10/04/2018 12/26/2017 02/21/2015  WBC 4.0  - 10.5 K/uL 16.1(H) 6.1 6.7  Hemoglobin 12.0 - 15.0 g/dL 16.1(H) 14.7 12.9  Hematocrit 36.0 - 46.0 % 48.7(H) 43.6 38.5  Platelets 150 - 400 K/uL 347 285 289    BMET BMP Latest Ref Rng & Units 10/04/2018 12/26/2017 02/21/2015  Glucose 70 - 99 mg/dL 155(H) 114(H) 106(H)  BUN 6 - 20 mg/dL _0 Creatinine 0.44 - 1.00 mg/dL 0.82 0.70 0.85  Sodium 135 - 145 mmol/L 141 143 141  Potassium 3.5 - 5.1 mmol/L 5.2(H) 3.8 3.5  Chloride 98 - 111 mmol/L 102 108 106  CO2 22 - 32 mmol/L _1 Calcium 8.9 - 10.3 mg/dL 11.2(H) 10.3 10.0    LFT Hepatic Function Latest Ref Rng & Units 10/04/2018  Total Protein 6.5 - 8.1 g/dL 8.6(H)  Albumin 3.5 - 5.0 g/dL 5.3(H)  AST 15 - 41 U/L 37  ALT 0 - 44 U/L 24  Alk Phosphatase 38 - 126 U/L 73  Total Bilirubin 0.3 - 1.2 mg/dL 0.6     STUDIES: Ct Abdomen Pelvis W Contrast  Result Date: 10/04/2018 CLINICAL DATA:  58 year old female  with history of nausea and vomiting with intermittent but sudden onset abdominal pain centered at the umbilicus. EXAM: CT ABDOMEN AND PELVIS WITH CONTRAST TECHNIQUE: Multidetector CT imaging of the abdomen and pelvis was performed using the standard protocol following bolus administration of intravenous contrast. CONTRAST:  160m OMNIPAQUE IOHEXOL 300 MG/ML  SOLN COMPARISON:  No priors. FINDINGS: Lower chest: Unremarkable. Hepatobiliary: No suspicious cystic or solid hepatic lesions. No intra or extrahepatic biliary ductal dilatation. Gallbladder is normal in appearance. Pancreas: No pancreatic mass. No pancreatic ductal dilatation. No pancreatic or peripancreatic fluid or inflammatory changes. Spleen: Unremarkable. Adrenals/Urinary Tract: Subcentimeter low-attenuation lesion in the upper pole the left kidney, too small to characterize, but statistically likely a cyst. Right kidney and bilateral adrenal glands are normal in appearance. No hydroureteronephrosis. Urinary bladder is normal in appearance. Stomach/Bowel: Normal appearance  of the stomach. Normal appendix. Several mildly prominent but nondilated loops of fluid-filled small bowel are noted, nonspecific. Relative narrowing of the distal and terminal ileum which demonstrates mural thickening and mucosal hyperenhancement without overt surrounding inflammatory changes. Several other short segment areas of narrowing of the small bowel lumen with associated mural thickening and mucosal hyperenhancement are also noted elsewhere. Normal appendix. Vascular/Lymphatic: No significant atherosclerotic disease, aneurysm or dissection noted in the abdominal or pelvic vasculature. No lymphadenopathy noted in the abdomen or pelvis. Reproductive: Status post hysterectomy. Ovaries are not confidently identified may be surgically absent or atrophic. Other: No significant volume of ascites.  No pneumoperitoneum. Musculoskeletal: There are no aggressive appearing lytic or blastic lesions noted in the visualized portions of the skeleton. IMPRESSION: 1. There is a spectrum of findings in the abdomen concerning for potential enteritis such as Crohn's disease, as detailed above. 2. Normal appendix. Electronically Signed   By: DVinnie LangtonM.D.   On: 10/04/2018 04:13      Impression / Plan:   Savannah Palleschiis a 58y.o. female with no significant past medical history presented with 1 week history of abdominal pain associated with nausea and vomiting and CT revealed enteritis.  Probably infectious enteritis.  However, with her age Crohn's is a possibility.  Do not recommend steroids at this time.  Continue empiric antibiotics for 5 days.  If her leukocytosis is improving, and able to tolerate diet, patient can be discharged home tomorrow and follow-up with me in 1 to 2 weeks as outpatient  Thank you for involving me in the care of this patient.      LOS: 0 days   RSherri Sear MD  10/04/2018, 5:05 PM   Note: This dictation was prepared with Dragon dictation along with smaller phrase  technology. Any transcriptional errors that result from this process are unintentional.

## 2018-10-04 NOTE — Progress Notes (Signed)
Pt admission completed. IVF's with metronidazole and cipro IV given. Reports abdominal discomfort 3/10 with interventions indicated. Tolerating clear liquid diet. Enteric precautions initiated. No stool thus far since admission.

## 2018-10-04 NOTE — ED Notes (Signed)
ED TO INPATIENT HANDOFF REPORT  ED Nurse Name and Phone #: Shanda Bumps [phone]  S Name/Age/Gender Savannah Myers 58 y.o. female Room/Bed: ED15A/ED15A  Code Status   Code Status: Not on file  Home/SNF/Other Home Patient oriented to: self, place, time and situation Is this baseline? Yes   Triage Complete: Triage complete  Chief Complaint Abd Pain Vomiting  Triage Note Patient c/o abdominal pain, emesis. Denies diarrhea.    Allergies No Known Allergies  Level of Care/Admitting Diagnosis ED Disposition    ED Disposition Condition Comment   Admit  Hospital Area[facility] [[id number]]  Level of Care: Med-Surg [16]  Covid Evaluation: N/A  Diagnosis: Enteritis [025852]  Admitting Physician: Willadean Carol DODD [[id number]]  Attending Physician: Willadean Carol DODD [[id number]]  Estimated length of stay: past midnight tomorrow  Certification:: I certify this patient will need inpatient services for at least 2 midnights  PT Class (Do Not Modify): Inpatient [101]  PT Acc Code (Do Not Modify): Private [1]       B Medical/Surgery History Past Medical History:  Diagnosis Date  . Depression   . Hypertension    Past Surgical History:  Procedure Laterality Date  . ABDOMINAL HYSTERECTOMY       A IV Location/Drains/Wounds Patient Lines/Drains/Airways Status   Active Line/Drains/Airways    Name:   Placement date:   Placement time:   Site:   Days:   Peripheral IV 10/04/18 Left Antecubital   10/04/18    0340    Antecubital   less than 1          Intake/Output Last 24 hours No intake or output data in the 24 hours ending 10/04/18 0737  Labs/Imaging Results for orders placed or performed during the hospital encounter of 10/04/18 (from the past 48 hour(s))  Lipase, blood     Status: None   Collection Time: 10/04/18 12:42 AM  Result Value Ref Range   Lipase 23 11 - 51 U/L    Comment: Performed at [facility], [address] [place], [place]  [address]  Comprehensive metabolic panel     Status: Abnormal   Collection Time: 10/04/18 12:42 AM  Result Value Ref Range   Sodium 141 135 - 145 mmol/L   Potassium 5.2 (H) 3.5 - 5.1 mmol/L    Comment: HEMOLYSIS AT THIS LEVEL MAY AFFECT RESULT   Chloride 102 98 - 111 mmol/L   CO2 25 22 - 32 mmol/L   Glucose, Bld 155 (H) 70 - 99 mg/dL   BUN 20 6 - 20 mg/dL   Creatinine, Ser 8.67 0.44 - 1.00 mg/dL   Calcium 61.9 (H) 8.9 - 10.3 mg/dL   Total Protein 8.6 (H) 6.5 - 8.1 g/dL   Albumin 5.3 (H) 3.5 - 5.0 g/dL   AST 37 15 - 41 U/L    Comment: HEMOLYSIS AT THIS LEVEL MAY AFFECT RESULT   ALT 24 0 - 44 U/L   Alkaline Phosphatase 73 38 - 126 U/L   Total Bilirubin 0.6 0.3 - 1.2 mg/dL    Comment: HEMOLYSIS AT THIS LEVEL MAY AFFECT RESULT   GFR calc non Af Amer >60 >60 mL/min   GFR calc Af Amer >60 >60 mL/min   Anion gap 14 5 - 15    Comment: Performed at [facility], [address] [place], [place] [address]  CBC     Status: Abnormal   Collection Time: 10/04/18 12:42 AM  Result Value Ref Range   WBC 16.1 (  H) 4.0 - 10.5 K/uL   RBC 5.76 (H) 3.87 - 5.11 MIL/uL   Hemoglobin 16.1 (H) 12.0 - 15.0 g/dL   HCT 29.5 (H) 28.4 - 13.2 %   MCV 84.5 80.0 - 100.0 fL   MCH 28.0 26.0 - 34.0 pg   MCHC 33.1 30.0 - 36.0 g/dL   RDW 44.0 10.2 - 72.5 %   Platelets 347 150 - 400 K/uL   nRBC 0.0 0.0 - 0.2 %    Comment: Performed at [facility], [address] [place], [place] [address]  Troponin I - Add-On to previous collection     Status: None   Collection Time: 10/04/18 12:42 AM  Result Value Ref Range   Troponin I <0.03 <0.03 ng/mL    Comment: Performed at [facility], [address] [place], [place] [address]  Urinalysis, Complete w Microscopic     Status: Abnormal   Collection Time: 10/04/18  3:26 AM  Result Value Ref Range   Color, Urine YELLOW (A) YELLOW   APPearance CLOUDY (A) CLEAR   Specific Gravity, Urine 1.022 1.005 - 1.030   pH 9.0 (H) 5.0 - 8.0   Glucose, UA  NEGATIVE NEGATIVE mg/dL   Hgb urine dipstick NEGATIVE NEGATIVE   Bilirubin Urine NEGATIVE NEGATIVE   Ketones, ur 5 (A) NEGATIVE mg/dL   Protein, ur >=259 (A) NEGATIVE mg/dL   Nitrite NEGATIVE NEGATIVE   Leukocytes,Ua LARGE (A) NEGATIVE   RBC / HPF 6-10 0 - 5 RBC/hpf   WBC, UA 11-20 0 - 5 WBC/hpf   Bacteria, UA NONE SEEN NONE SEEN   Squamous Epithelial / LPF 11-20 0 - 5   Mucus PRESENT     Comment: Performed at [facility], [address] [place], [place] [address]  Pregnancy, urine POC     Status: None   Collection Time: 10/04/18  3:36 AM  Result Value Ref Range   Preg Test, Ur NEGATIVE NEGATIVE    Comment:        THE SENSITIVITY OF THIS METHODOLOGY IS >24 mIU/mL   SARS Coronavirus 2 (CEPHEID - Performed in [facility] hospital lab), Hosp Order     Status: None   Collection Time: 10/04/18  5:58 AM  Result Value Ref Range   SARS Coronavirus 2 NEGATIVE NEGATIVE    Comment: (NOTE) If result is NEGATIVE SARS-CoV-2 target nucleic acids are NOT DETECTED. The SARS-CoV-2 RNA is generally detectable in upper and lower  respiratory specimens during the acute phase of infection. The lowest  concentration of SARS-CoV-2 viral copies this assay can detect is 250  copies / mL. A negative result does not preclude SARS-CoV-2 infection  and should not be used as the sole basis for treatment or other  patient management decisions.  A negative result may occur with  improper specimen collection / handling, submission of specimen other  than nasopharyngeal swab, presence of viral mutation(s) within the  areas targeted by this assay, and inadequate number of viral copies  (<250 copies / mL). A negative result must be combined with clinical  observations, patient history, and epidemiological information. If result is POSITIVE SARS-CoV-2 target nucleic acids are DETECTED. The SARS-CoV-2 RNA is generally detectable in upper and lower  respiratory specimens dur ing the acute phase of  infection.  Positive  results are indicative of active infection with SARS-CoV-2.  Clinical  correlation with patient history and other diagnostic information is  necessary to determine patient infection status.  Positive results do  not  rule out bacterial infection or co-infection with other viruses. If result is PRESUMPTIVE POSTIVE SARS-CoV-2 nucleic acids MAY BE PRESENT.   A presumptive positive result was obtained on the submitted specimen  and confirmed on repeat testing.  While 2019 novel coronavirus  (SARS-CoV-2) nucleic acids may be present in the submitted sample  additional confirmatory testing may be necessary for epidemiological  and / or clinical management purposes  to differentiate between  SARS-CoV-2 and other Sarbecovirus currently known to infect humans.  If clinically indicated additional testing with an alternate test  methodology [phone]) is advised. The SARS-CoV-2 RNA is generally  detectable in upper and lower respiratory sp ecimens during the acute  phase of infection. The expected result is Negative. Fact Sheet for Patients:  BoilerBrush.com.cy Fact Sheet for Healthcare Providers: https://pope.com/ This test is not yet approved or cleared by the [place] FDA and has been authorized for detection and/or diagnosis of SARS-CoV-2 by FDA under an Emergency Use Authorization (EUA).  This EUA will remain in effect (meaning this test can be used) for the duration of the COVID-19 declaration under Section 564(b)(1) of the Act, 21 U.S.C. section 360bbb-3(b)(1), unless the authorization is terminated or revoked sooner. Performed at [facility], [address] [place], [place] [address]    Ct Abdomen Pelvis W Contrast  Result Date: 10/04/2018 CLINICAL DATA:  58 year old female with history of nausea and vomiting with intermittent but sudden onset abdominal pain centered at the umbilicus. EXAM: CT ABDOMEN  AND PELVIS WITH CONTRAST TECHNIQUE: Multidetector CT imaging of the abdomen and pelvis was performed using the standard protocol following bolus administration of intravenous contrast. CONTRAST:  OMNIPAQUE IOHEXOL 300 MG/ML  SOLN COMPARISON:  No priors. FINDINGS: Lower chest: Unremarkable. Hepatobiliary: No suspicious cystic or solid hepatic lesions. No intra or extrahepatic biliary ductal dilatation. Gallbladder is normal in appearance. Pancreas: No pancreatic mass. No pancreatic ductal dilatation. No pancreatic or peripancreatic fluid or inflammatory changes. Spleen: Unremarkable. Adrenals/Urinary Tract: Subcentimeter low-attenuation lesion in the upper pole the left kidney, too small to characterize, but statistically likely a cyst. Right kidney and bilateral adrenal glands are normal in appearance. No hydroureteronephrosis. Urinary bladder is normal in appearance. Stomach/Bowel: Normal appearance of the stomach. Normal appendix. Several mildly prominent but nondilated loops of fluid-filled small bowel are noted, nonspecific. Relative narrowing of the distal and terminal ileum which demonstrates mural thickening and mucosal hyperenhancement without overt surrounding inflammatory changes. Several other short segment areas of narrowing of the small bowel lumen with associated mural thickening and mucosal hyperenhancement are also noted elsewhere. Normal appendix. Vascular/Lymphatic: No significant atherosclerotic disease, aneurysm or dissection noted in the abdominal or pelvic vasculature. No lymphadenopathy noted in the abdomen or pelvis. Reproductive: Status post hysterectomy. Ovaries are not confidently identified may be surgically absent or atrophic. Other: No significant volume of ascites.  No pneumoperitoneum. Musculoskeletal: There are no aggressive appearing lytic or blastic lesions noted in the visualized portions of the skeleton. IMPRESSION: 1. There is a spectrum of findings in the abdomen  concerning for potential enteritis such as Crohn's disease, as detailed above. 2. Normal appendix. Electronically Signed   By: Trudie Reed M.D.   On: 10/04/2018 04:13    Pending Labs Unresulted Labs (From admission, onward)    Start     Ordered   10/04/18 8119  Urine Culture  Add-on,   AD    Question:  Patient immune status  Answer:  Normal   10/04/18 0411   Signed and Held  HIV  antibody (Routine Testing)  Once,   R     Signed and Held   Signed and Held  Basic metabolic panel  Tomorrow morning,   R     Signed and Held   Signed and Held  CBC  Tomorrow morning,   R     Signed and Held   Signed and Held  Gastrointestinal Panel by PCR , Stool  (Gastrointestinal Panel by PCR, Stool)  Once,   R     Signed and Held          Vitals/Pain Today's Vitals   10/04/18 0532 10/04/18 0600 10/04/18 0600 10/04/18 0700  BP: 126/71  134/71   Pulse: (!) 57  (!) 59   Resp: 16  16   Temp:      SpO2: 100%  100%   Weight:      Height:    5' (1.524 m)  PainSc: 1  1  1       Isolation Precautions No active isolations  Medications Medications  sodium chloride flush (NS) 0.9 % injection 3 mL (3 mLs Intravenous Not Given 10/04/18 0306)  ciprofloxacin (CIPRO) IVPB 400 mg (has no administration in time range)  morphine 4 MG/ML injection 4 mg (4 mg Intravenous Given 10/04/18 0344)  ondansetron (ZOFRAN) injection 4 mg (4 mg Intravenous Given 10/04/18 0342)  iohexol (OMNIPAQUE) 300 MG/ML solution 100 mL (100 mLs Intravenous Contrast Given 10/04/18 0354)    Mobility walks Low fall risk   Focused Assessments GI assessment   R Recommendations: See Admitting Provider Note  Report given to:   Additional Notes:

## 2018-10-05 LAB — BASIC METABOLIC PANEL
Anion gap: 6 (ref 5–15)
BUN: 16 mg/dL (ref 6–20)
CO2: 26 mmol/L (ref 22–32)
Calcium: 8.6 mg/dL — ABNORMAL LOW (ref 8.9–10.3)
Chloride: 112 mmol/L — ABNORMAL HIGH (ref 98–111)
Creatinine, Ser: 0.69 mg/dL (ref 0.44–1.00)
GFR calc Af Amer: >60 mL/min (ref >60)
GFR calc non Af Amer: >60 mL/min (ref >60)
Glucose, Bld: 99 mg/dL (ref 70–99)
Potassium: 4 mmol/L (ref 3.5–5.1)
Sodium: 144 mmol/L (ref 135–145)

## 2018-10-05 LAB — URINE CULTURE: Special Requests: NORMAL

## 2018-10-05 LAB — CBC
HCT: 39.1 % (ref 36.0–46.0)
Hemoglobin: 12.5 g/dL (ref 12.0–15.0)
MCH: 28.3 pg (ref 26.0–34.0)
MCHC: 32 g/dL (ref 30.0–36.0)
MCV: 88.5 fL (ref 80.0–100.0)
Platelets: 250 10*3/uL (ref 150–400)
RBC: 4.42 MIL/uL (ref 3.87–5.11)
RDW: 13.4 % (ref 11.5–15.5)
WBC: 5.4 10*3/uL (ref 4.0–10.5)
nRBC: 0 % (ref 0.0–0.2)

## 2018-10-05 LAB — SEDIMENTATION RATE: Sed Rate: 6 mm/hr (ref 0–30)

## 2018-10-05 LAB — C-REACTIVE PROTEIN: CRP: <0.8 mg/dL (ref <1.0)

## 2018-10-05 MED ORDER — METRONIDAZOLE 250 MG PO TABS: 250.0000 mg | ORAL_TABLET | Freq: Three times a day (TID) | ORAL | 0 refills | Status: AC

## 2018-10-05 MED ORDER — CIPROFLOXACIN HCL 500 MG PO TABS: 500.0000 mg | ORAL_TABLET | Freq: Two times a day (BID) | ORAL | 0 refills | Status: AC

## 2018-10-05 NOTE — Progress Notes (Signed)
Denies n/v/d with no stools since admission. Denies pain. VSS. IV antibiotics cipro and flagyl given. Agreeable with discharge home to self care.

## 2018-10-05 NOTE — Progress Notes (Signed)
Discharged home to self/family care. Pt transported to private vehicle in transport chair. Case mgt provided resources to establish PCP care again. No further needs.

## 2018-10-05 NOTE — Progress Notes (Signed)
[facility] - [place] at [facility]   PATIENT NAME: Savannah Myers    MR#:  [id number]  DATE OF BIRTH:  November 24, 1960  SUBJECTIVE:  CHIEF COMPLAINT:   Chief Complaint  Patient presents with  . Abdominal Pain   Came with abd pain and vomiting, no diarrhea. Feels slightly better - had tolerated liquid diet.  REVIEW OF SYSTEMS:   CONSTITUTIONAL: No fever, fatigue or weakness.  EYES: No blurred or double vision.  EARS, NOSE, AND THROAT: No tinnitus or ear pain.  RESPIRATORY: No cough, shortness of breath, wheezing or hemoptysis.  CARDIOVASCULAR: No chest pain, orthopnea, edema.  GASTROINTESTINAL: No nausea, vomiting, diarrhea or abdominal pain.  GENITOURINARY: No dysuria, hematuria.  ENDOCRINE: No polyuria, nocturia,  HEMATOLOGY: No anemia, easy bruising or bleeding SKIN: No rash or lesion. MUSCULOSKELETAL: No joint pain or arthritis.   NEUROLOGIC: No tingling, numbness, weakness.  PSYCHIATRY: No anxiety or depression.   ROS  DRUG ALLERGIES:  No Known Allergies  VITALS:  Blood pressure (!) 103/59, pulse (!) 54, temperature 98.2 F (36.8 C), temperature source Oral, resp. rate 18, height 5' (1.524 m), weight 68.8 kg, SpO2 98 %.  PHYSICAL EXAMINATION:  GENERAL:  58 y.o.-year-old patient lying in the bed with no acute distress.  EYES: Pupils equal, round, reactive to light and accommodation. No scleral icterus. Extraocular muscles intact.  HEENT: Head atraumatic, normocephalic. Oropharynx and nasopharynx clear.  NECK:  Supple, no jugular venous distention. No thyroid enlargement, no tenderness.  LUNGS: Normal breath sounds bilaterally, no wheezing, rales,rhonchi or crepitation. No use of accessory muscles of respiration.  CARDIOVASCULAR: S1, S2 normal. No murmurs, rubs, or gallops.  ABDOMEN: Soft, nontender, nondistended. Bowel sounds present. No organomegaly or mass.  EXTREMITIES: No pedal edema, cyanosis, or clubbing.  NEUROLOGIC: Cranial nerves II through  XII are intact. Muscle strength 5/5 in all extremities. Sensation intact. Gait not checked.  PSYCHIATRIC: The patient is alert and oriented x 3.  SKIN: No obvious rash, lesion, or ulcer.   Physical Exam LABORATORY PANEL:   CBC Recent Labs  Lab 10/05/18 0458  WBC 5.4  HGB 12.5  HCT 39.1  PLT 250   ------------------------------------------------------------------------------------------------------------------  Chemistries  Recent Labs  Lab 10/04/18 0042 10/05/18 0458  NA 141 144  K 5.2* 4.0  CL 102 112*  CO2 25 26  GLUCOSE 155* 99  BUN 20 16  CREATININE 0.82 0.69  CALCIUM 11.2* 8.6*  AST 37  --   ALT 24  --   ALKPHOS 73  --   BILITOT 0.6  --    ------------------------------------------------------------------------------------------------------------------  Cardiac Enzymes Recent Labs  Lab 10/04/18 0042  TROPONINI <0.03   ------------------------------------------------------------------------------------------------------------------  RADIOLOGY:  Ct Abdomen Pelvis W Contrast  Result Date: 10/04/2018 CLINICAL DATA:  58 year old female with history of nausea and vomiting with intermittent but sudden onset abdominal pain centered at the umbilicus. EXAM: CT ABDOMEN AND PELVIS WITH CONTRAST TECHNIQUE: Multidetector CT imaging of the abdomen and pelvis was performed using the standard protocol following bolus administration of intravenous contrast. CONTRAST:  OMNIPAQUE IOHEXOL 300 MG/ML  SOLN COMPARISON:  No priors. FINDINGS: Lower chest: Unremarkable. Hepatobiliary: No suspicious cystic or solid hepatic lesions. No intra or extrahepatic biliary ductal dilatation. Gallbladder is normal in appearance. Pancreas: No pancreatic mass. No pancreatic ductal dilatation. No pancreatic or peripancreatic fluid or inflammatory changes. Spleen: Unremarkable. Adrenals/Urinary Tract: Subcentimeter low-attenuation lesion in the upper pole the left kidney, too small to characterize,  but statistically likely a cyst. Right kidney and bilateral  adrenal glands are normal in appearance. No hydroureteronephrosis. Urinary bladder is normal in appearance. Stomach/Bowel: Normal appearance of the stomach. Normal appendix. Several mildly prominent but nondilated loops of fluid-filled small bowel are noted, nonspecific. Relative narrowing of the distal and terminal ileum which demonstrates mural thickening and mucosal hyperenhancement without overt surrounding inflammatory changes. Several other short segment areas of narrowing of the small bowel lumen with associated mural thickening and mucosal hyperenhancement are also noted elsewhere. Normal appendix. Vascular/Lymphatic: No significant atherosclerotic disease, aneurysm or dissection noted in the abdominal or pelvic vasculature. No lymphadenopathy noted in the abdomen or pelvis. Reproductive: Status post hysterectomy. Ovaries are not confidently identified may be surgically absent or atrophic. Other: No significant volume of ascites.  No pneumoperitoneum. Musculoskeletal: There are no aggressive appearing lytic or blastic lesions noted in the visualized portions of the skeleton. IMPRESSION: 1. There is a spectrum of findings in the abdomen concerning for potential enteritis such as Crohn's disease, as detailed above. 2. Normal appendix. Electronically Signed   By: Trudie Reedaniel  Entrikin M.D.   On: 10/04/2018 04:13    ASSESSMENT AND PLAN:   Active Problems:   Enteritis  Enteritis- likely due to infectious process versus inflammatory bowel disease,  -GI consulted - empiric ciprofloxacin and Flagyl -Clear liquid diet tolerated upgraded to soft diet. -GI pathogen panel to rule out infectious cause- but she does not have BM yet. -IVFs  Possible UTI- patient not having any urinary symptoms. -On ciprofloxacin for enteritis -Urine culture pending  Hyperkalemia- K 5.2 -Hold home ACE -Recheck K tomorrow  Hypertension- BPs controlled -Holding  home ACE due to hyperkalemia -Hydralazine IV prn     All the records are reviewed and case discussed with Care Management/Social Workerr. Management plans discussed with the patient, family and they are in agreement.  CODE STATUS: Full.  TOTAL TIME TAKING CARE OF THIS PATIENT: 35 minutes.     POSSIBLE D/C IN 1-2 DAYS, DEPENDING ON CLINICAL CONDITION.   Altamese DillingVaibhavkumar Elliette Seabolt M.D on 10/05/2018   Between 7am to 6pm - Pager - [phone]  After 6pm go to [email or web address] - password [email or web address]  [facility] [place] [facility]  Office  [phone]  CC: Primary care physician; Leotis ShamesSingh, Jasmine, MD  Note: This dictation was prepared with Dragon dictation along with smaller phrase technology. Any transcriptional errors that result from this process are unintentional.

## 2018-10-05 NOTE — Discharge Instructions (Signed)
Dolor abdominal en los adultos  Abdominal Pain, Adult    El dolor de estmago (abdominal) puede tener muchas causas. La mayora de las veces, el dolor de estmago no es peligroso. Muchos de estos casos de dolor de estmago pueden controlarse y tratarse en casa. Sin embargo, a veces, el dolor de estmago puede ser grave. El mdico intentar descubrir la causa del dolor de estmago.  Siga estas indicaciones en su casa:   Tome los medicamentos de venta libre y los recetados solamente como se lo haya indicado el mdico. No tome medicamentos que lo ayuden a defecar (laxantes), salvo que el mdico se lo indique.   Beba suficiente lquido para mantener el pis (orina) claro o de color amarillo plido.   Est atento al dolor de estmago para detectar cualquier cambio.   Concurra a todas las visitas de control como se lo haya indicado el mdico. Esto es importante.  Comunquese con un mdico si:   El dolor de estmago cambia o empeora.   No tiene apetito o baja de peso sin proponrselo.   Tiene dificultades para defecar (est estreido) o heces lquidas (diarrea) durante ms de 2 o 3das.   Siente dolor al orinar o defecar.   El dolor de estmago lo despierta de noche.   El dolor empeora con las comidas, despus de comer o con determinados alimentos.   Vomita y no puede retener nada de lo que ingiere.   Tiene fiebre.  Solicite ayuda de inmediato si:   El dolor no desaparece en el tiempo indicado por el mdico.   No puede detener los vmitos.   Siente dolor solamente en zonas especficas del abdomen, como el lado derecho o la parte inferior izquierda.   Tiene heces con sangre, de color negro o con aspecto alquitranado.   Tiene dolor muy intenso en el vientre, clicos o meteorismo.   Presenta signos de no tener suficientes lquidos o agua en el cuerpo (deshidratacin), por ejemplo:  ? La orina es oscura, es muy escasa o no orina.  ? Labios agrietados.  ? Boca seca.  ? Ojos  hundidos.  ? Somnolencia.  ? Debilidad.  Esta informacin no tiene como fin reemplazar el consejo del mdico. Asegrese de hacerle al mdico cualquier pregunta que tenga.  Document Released: 07/27/2008 Document Revised: 07/25/2016 Document Reviewed: 10/12/2015  Elsevier Interactive Patient Education  2019 Elsevier Inc.

## 2018-10-05 NOTE — Discharge Summary (Signed)
[facility] [place] at [facility]   PATIENT NAME: Savannah Myers    MR#:  [id number]  DATE OF BIRTH:  12/15/1960  DATE OF ADMISSION:  10/04/2018 ADMITTING PHYSICIAN: Campbell StallKaty Dodd Mayo, MD  DATE OF DISCHARGE: 10/05/2018   PRIMARY CARE PHYSICIAN: Leotis ShamesSingh, Jasmine, MD    ADMISSION DIAGNOSIS:  Enteritis [K52.9] Periumbilical abdominal pain [R10.33] Volume depletion [E86.9] Urinary tract infection without hematuria, site unspecified [N39.0] Non-intractable vomiting with nausea, unspecified vomiting type [R11.2]  DISCHARGE DIAGNOSIS:  Active Problems:   Enteritis   SECONDARY DIAGNOSIS:   Past Medical History:  Diagnosis Date  . Depression   . Hypertension     HOSPITAL COURSE:   Enteritis-likely due to infectious process versus inflammatory bowel disease, -GI consulted-suggested continue antibiotic and if it feels better she can be discharged and follow-up as outpatient in 2 weeks. - empiric ciprofloxacin and Flagyl -Clear liquid diet tolerated upgraded to soft diet. Tolerated well. -GI pathogen panel to rule out infectious cause- but she does not have BM yet. -IVFs  Possible UTI- patient not having any urinary symptoms. -On ciprofloxacin for enteritis -Urine culture -did not show any clear pathogen.  Hyperkalemia- K 5.2 -Hold home ACE -Recheck K came down.  Hypertension- BPs controlled -Holding home ACE due to hyperkalemia -Hydralazine IV prn -Her blood pressure was running on borderline lower normal side, so I advised to hold blood pressure medications on discharge.  DISCHARGE CONDITIONS:   Stable. CONSULTS OBTAINED:  Treatment Team:  Toney ReilVanga, Rohini Reddy, MD  DRUG ALLERGIES:  No Known Allergies  DISCHARGE MEDICATIONS:   Allergies as of 10/05/2018   No Known Allergies     Medication List    STOP taking these medications   naproxen 500 MG tablet Commonly known as:  Naprosyn   telmisartan 40 MG tablet Commonly known as:   MICARDIS     TAKE these medications   ciprofloxacin 500 MG tablet Commonly known as:  Cipro Take 1 tablet (500 mg total) by mouth 2 (two) times daily for 3 days.   metroNIDAZOLE 250 MG tablet Commonly known as:  Flagyl Take 1 tablet (250 mg total) by mouth 3 (three) times daily for 3 days. Notes to patient:  Do not drink any alcohol- beer, wine or liquor while taking this medication and wait 2 weeks after you complete your course of antibiotic.        DISCHARGE INSTRUCTIONS:    Follow with GI clinic in 2 weeks.  If you experience worsening of your admission symptoms, develop shortness of breath, life threatening emergency, suicidal or homicidal thoughts you must seek medical attention immediately by calling 911 or calling your MD immediately  if symptoms less severe.  You Must read complete instructions/literature along with all the possible adverse reactions/side effects for all the Medicines you take and that have been prescribed to you. Take any new Medicines after you have completely understood and accept all the possible adverse reactions/side effects.   Please note  You were cared for by a hospitalist during your hospital stay. If you have any questions about your discharge medications or the care you received while you were in the hospital after you are discharged, you can call the unit and asked to speak with the hospitalist on call if the hospitalist that took care of you is not available. Once you are discharged, your primary care physician will handle any further medical issues. Please note that NO REFILLS for any discharge medications will be authorized once you  are discharged, as it is imperative that you return to your primary care physician (or establish a relationship with a primary care physician if you do not have one) for your aftercare needs so that they can reassess your need for medications and monitor your lab values.    Today   CHIEF COMPLAINT:   Chief  Complaint  Patient presents with  . Abdominal Pain    HISTORY OF PRESENT ILLNESS:  Savannah Myers  is a 58 y.o. female with a known history of hypertension and depression who presented to the ED with abdominal pain over the last 2 days.  The pain is located directly above her bellybutton.  She describes the pain as "sharp".  She is also had multiple episodes of vomiting.  She denies any hematemesis or hematochezia.  She has been having normal bowel movements.  No diarrhea or constipation.  She denies any fevers or chills.  She denies any sick contacts or travel recently.  In the ED, vitals were unremarkable.  Labs are significant for K 5.2, WBC 16.1, hemoglobin 16.1.  UA with large leukocytes, >300 protein, no bacteria.  CT abdomen pelvis with possible enteritis.  GI was consulted and recommended Cipro and Flagyl, no steroids, liquid diet.  Hospitalists were called for admission.   VITAL SIGNS:  Blood pressure (!) 103/59, pulse (!) 54, temperature 98.2 F (36.8 C), temperature source Oral, resp. rate 18, height 5' (1.524 m), weight 68.8 kg, SpO2 98 %.  I/O:    Intake/Output Summary (Last 24 hours) at 10/05/2018 1351 Last data filed at 10/05/2018 1300 Gross per 24 hour  Intake 2535.76 ml  Output -  Net 2535.76 ml    PHYSICAL EXAMINATION:   GENERAL:  58 y.o.-year-old patient lying in the bed with no acute distress.  EYES: Pupils equal, round, reactive to light and accommodation. No scleral icterus. Extraocular muscles intact.  HEENT: Head atraumatic, normocephalic. Oropharynx and nasopharynx clear.  NECK:  Supple, no jugular venous distention. No thyroid enlargement, no tenderness.  LUNGS: Normal breath sounds bilaterally, no wheezing, rales,rhonchi or crepitation. No use of accessory muscles of respiration.  CARDIOVASCULAR: S1, S2 normal. No murmurs, rubs, or gallops.  ABDOMEN: Soft, nontender, nondistended. Bowel sounds present. No organomegaly or mass.  EXTREMITIES: No pedal  edema, cyanosis, or clubbing.  NEUROLOGIC: Cranial nerves II through XII are intact. Muscle strength 5/5 in all extremities. Sensation intact. Gait not checked.  PSYCHIATRIC: The patient is alert and oriented x 3.  SKIN: No obvious rash, lesion, or ulcer.   DATA REVIEW:   CBC Recent Labs  Lab 10/05/18 0458  WBC 5.4  HGB 12.5  HCT 39.1  PLT 250    Chemistries  Recent Labs  Lab 10/04/18 0042 10/05/18 0458  NA 141 144  K 5.2* 4.0  CL 102 112*  CO2 25 26  GLUCOSE 155* 99  BUN 20 16  CREATININE 0.82 0.69  CALCIUM 11.2* 8.6*  AST 37  --   ALT 24  --   ALKPHOS 73  --   BILITOT 0.6  --     Cardiac Enzymes Recent Labs  Lab 10/04/18 0042  TROPONINI <0.03    Microbiology Results  Results for orders placed or performed during the hospital encounter of 10/04/18  Urine Culture     Status: Abnormal   Collection Time: 10/04/18  3:26 AM  Result Value Ref Range Status   Specimen Description   Final    URINE, RANDOM Performed at [facility], [address] [place], [place] [address]    Special Requests   Final    Normal Performed at [facility], [address] [place], [place] [address]    Culture MULTIPLE SPECIES PRESENT, SUGGEST RECOLLECTION (A)  Final   Report Status 10/05/2018 FINAL  Final  SARS Coronavirus 2 (CEPHEID - Performed in [facility] hospital lab), Hosp Order     Status: None   Collection Time: 10/04/18  5:58 AM  Result Value Ref Range Status   SARS Coronavirus 2 NEGATIVE NEGATIVE Final    Comment: (NOTE) If result is NEGATIVE SARS-CoV-2 target nucleic acids are NOT DETECTED. The SARS-CoV-2 RNA is generally detectable in upper and lower  respiratory specimens during the acute phase of infection. The lowest  concentration of SARS-CoV-2 viral copies this assay can detect is 250  copies / mL. A negative result does not preclude SARS-CoV-2 infection  and should not be used as the sole basis for treatment or other  patient  management decisions.  A negative result may occur with  improper specimen collection / handling, submission of specimen other  than nasopharyngeal swab, presence of viral mutation(s) within the  areas targeted by this assay, and inadequate number of viral copies  (<250 copies / mL). A negative result must be combined with clinical  observations, patient history, and epidemiological information. If result is POSITIVE SARS-CoV-2 target nucleic acids are DETECTED. The SARS-CoV-2 RNA is generally detectable in upper and lower  respiratory specimens dur ing the acute phase of infection.  Positive  results are indicative of active infection with SARS-CoV-2.  Clinical  correlation with patient history and other diagnostic information is  necessary to determine patient infection status.  Positive results do  not rule out bacterial infection or co-infection with other viruses. If result is PRESUMPTIVE POSTIVE SARS-CoV-2 nucleic acids MAY BE PRESENT.   A presumptive positive result was obtained on the submitted specimen  and confirmed on repeat testing.  While 2019 novel coronavirus  (SARS-CoV-2) nucleic acids may be present in the submitted sample  additional confirmatory testing may be necessary for epidemiological  and / or clinical management purposes  to differentiate between  SARS-CoV-2 and other Sarbecovirus currently known to infect humans.  If clinically indicated additional testing with an alternate test  methodology [phone]) is advised. The SARS-CoV-2 RNA is generally  detectable in upper and lower respiratory sp ecimens during the acute  phase of infection. The expected result is Negative. Fact Sheet for Patients:  BoilerBrush.com.cy Fact Sheet for Healthcare Providers: https://pope.com/ This test is not yet approved or cleared by the [place] FDA and has been authorized for detection and/or diagnosis of SARS-CoV-2 by FDA under  an Emergency Use Authorization (EUA).  This EUA will remain in effect (meaning this test can be used) for the duration of the COVID-19 declaration under Section 564(b)(1) of the Act, 21 U.S.C. section 360bbb-3(b)(1), unless the authorization is terminated or revoked sooner. Performed at [facility], [address] [place], [place] [address]     RADIOLOGY:  Ct Abdomen Pelvis W Contrast  Result Date: 10/04/2018 CLINICAL DATA:  58 year old female with history of nausea and vomiting with intermittent but sudden onset abdominal pain centered at the umbilicus. EXAM: CT ABDOMEN AND PELVIS WITH CONTRAST TECHNIQUE: Multidetector CT imaging of the abdomen and pelvis was performed using the standard protocol following bolus administration of intravenous contrast. CONTRAST:  OMNIPAQUE IOHEXOL 300 MG/ML  SOLN COMPARISON:  No priors. FINDINGS: Lower chest: Unremarkable. Hepatobiliary: No suspicious  cystic or solid hepatic lesions. No intra or extrahepatic biliary ductal dilatation. Gallbladder is normal in appearance. Pancreas: No pancreatic mass. No pancreatic ductal dilatation. No pancreatic or peripancreatic fluid or inflammatory changes. Spleen: Unremarkable. Adrenals/Urinary Tract: Subcentimeter low-attenuation lesion in the upper pole the left kidney, too small to characterize, but statistically likely a cyst. Right kidney and bilateral adrenal glands are normal in appearance. No hydroureteronephrosis. Urinary bladder is normal in appearance. Stomach/Bowel: Normal appearance of the stomach. Normal appendix. Several mildly prominent but nondilated loops of fluid-filled small bowel are noted, nonspecific. Relative narrowing of the distal and terminal ileum which demonstrates mural thickening and mucosal hyperenhancement without overt surrounding inflammatory changes. Several other short segment areas of narrowing of the small bowel lumen with associated mural thickening and mucosal  hyperenhancement are also noted elsewhere. Normal appendix. Vascular/Lymphatic: No significant atherosclerotic disease, aneurysm or dissection noted in the abdominal or pelvic vasculature. No lymphadenopathy noted in the abdomen or pelvis. Reproductive: Status post hysterectomy. Ovaries are not confidently identified may be surgically absent or atrophic. Other: No significant volume of ascites.  No pneumoperitoneum. Musculoskeletal: There are no aggressive appearing lytic or blastic lesions noted in the visualized portions of the skeleton. IMPRESSION: 1. There is a spectrum of findings in the abdomen concerning for potential enteritis such as Crohn's disease, as detailed above. 2. Normal appendix. Electronically Signed   By: Trudie Reed M.D.   On: 10/04/2018 04:13    EKG:   Orders placed or performed during the hospital encounter of 12/26/17  . ED EKG within 10 minutes  . ED EKG within 10 minutes      Management plans discussed with the patient, family and they are in agreement.  CODE STATUS:     Code Status Orders  (From admission, onward)         Start     Ordered   10/04/18 0925  Full code  Continuous     10/04/18 0924        Code Status History    This patient has a current code status but no historical code status.      TOTAL TIME TAKING CARE OF THIS PATIENT: 35 minutes.    Altamese Dilling M.D on 10/05/2018 at 1:51 PM  Between 7am to 6pm - Pager - [phone]  After 6pm go to [email or web address] - password [email or web address]  [facility] [place] [facility]  Office  [phone]  CC: Primary care physician; Leotis Shames, MD   Note: This dictation was prepared with Dragon dictation along with smaller phrase technology. Any transcriptional errors that result from this process are unintentional.

## 2018-10-08 LAB — HIV ANTIBODY (ROUTINE TESTING W REFLEX): HIV Screen 4th Generation wRfx: NONREACTIVE

## 2018-10-23 ENCOUNTER — Ambulatory Visit: Payer: BLUE CROSS/BLUE SHIELD | Admitting: Gastroenterology

## 2018-12-08 ENCOUNTER — Ambulatory Visit: Payer: BLUE CROSS/BLUE SHIELD | Admitting: Gastroenterology

## 2018-12-08 ENCOUNTER — Encounter: Payer: Self-pay | Admitting: Gastroenterology

## 2018-12-11 ENCOUNTER — Encounter

## 2019-01-16 ENCOUNTER — Other Ambulatory Visit: Payer: Self-pay | Admitting: Internal Medicine

## 2019-01-16 DIAGNOSIS — Z1231 Encounter for screening mammogram for malignant neoplasm of breast: Secondary | ICD-10-CM

## 2019-02-23 ENCOUNTER — Ambulatory Visit
Admission: RE | Admit: 2019-02-23 | Discharge: 2019-02-23 | Disposition: A | Discharge status: Home / Self Care | Payer: BLUE CROSS/BLUE SHIELD | Source: Ambulatory Visit | Attending: Internal Medicine | Admitting: Internal Medicine

## 2019-02-23 DIAGNOSIS — Z1231 Encounter for screening mammogram for malignant neoplasm of breast: Secondary | ICD-10-CM | POA: Diagnosis not present

## 2019-04-26 IMAGING — MG MM DIGITAL SCREENING BILAT W/ TOMO W/ CAD
8 of 12 series · 8 of 28 positions shown · non-contrast
Comparison: Previous exam(s).

CLINICAL DATA: Screening.

EXAM:
2D DIGITAL SCREENING BILATERAL MAMMOGRAM WITH CAD AND ADJUNCT TOMO

[L MLO]
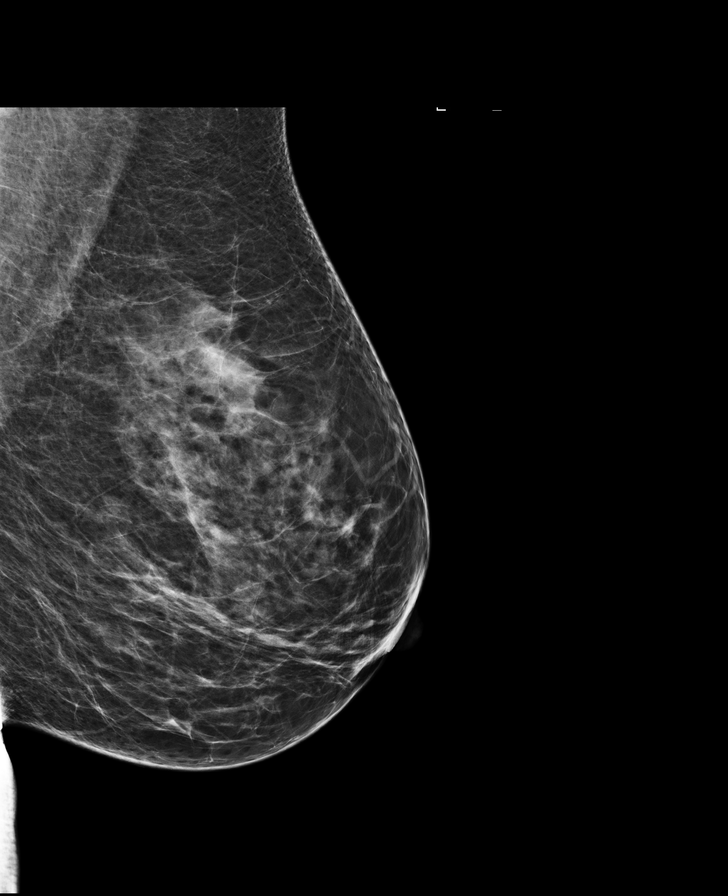

[L CC synth-2D]
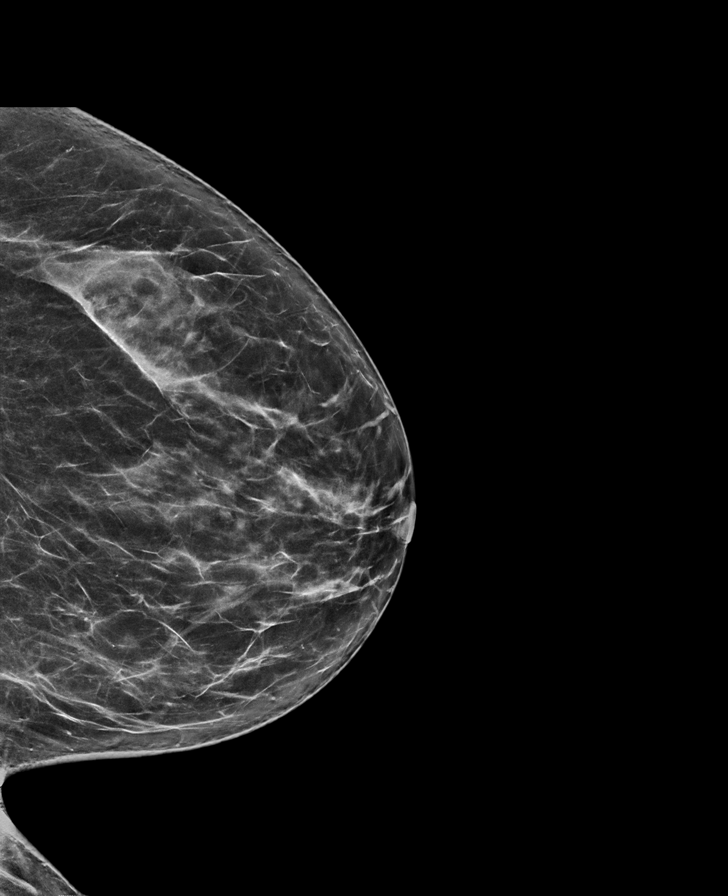

[R CC synth-2D]
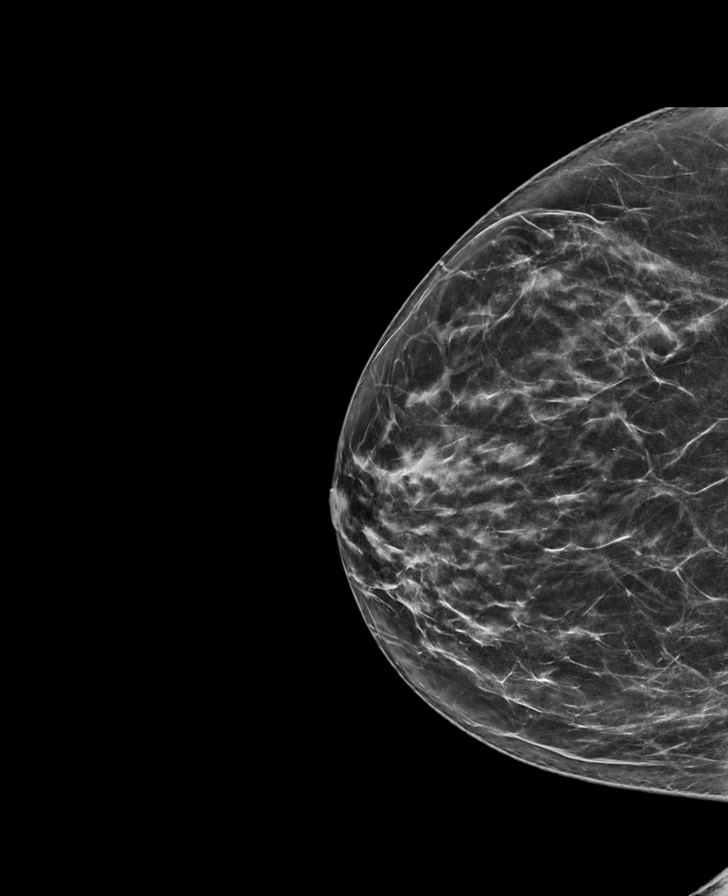

[R MLO]
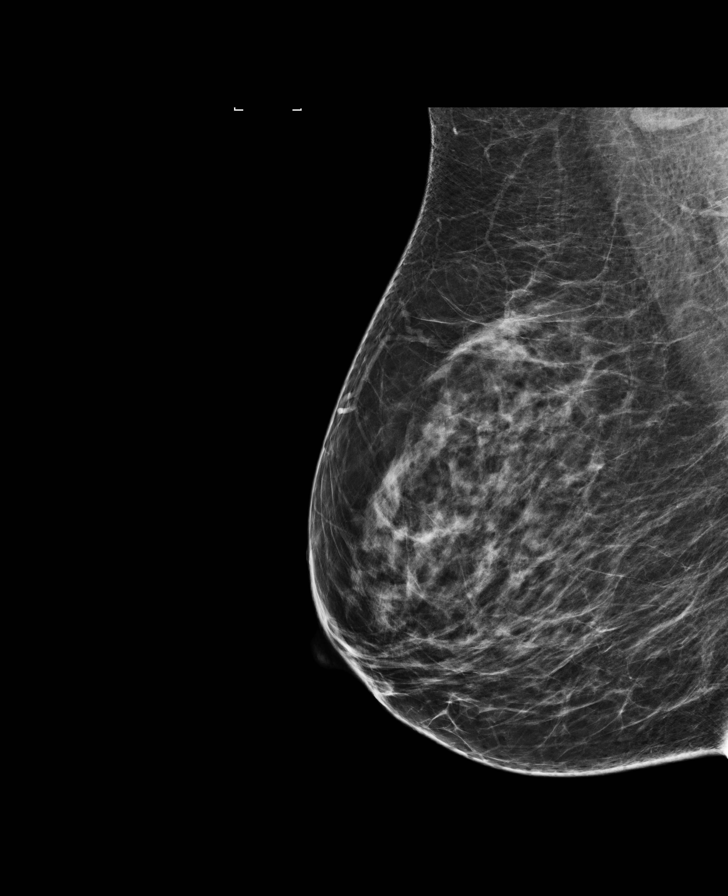

[R CC]
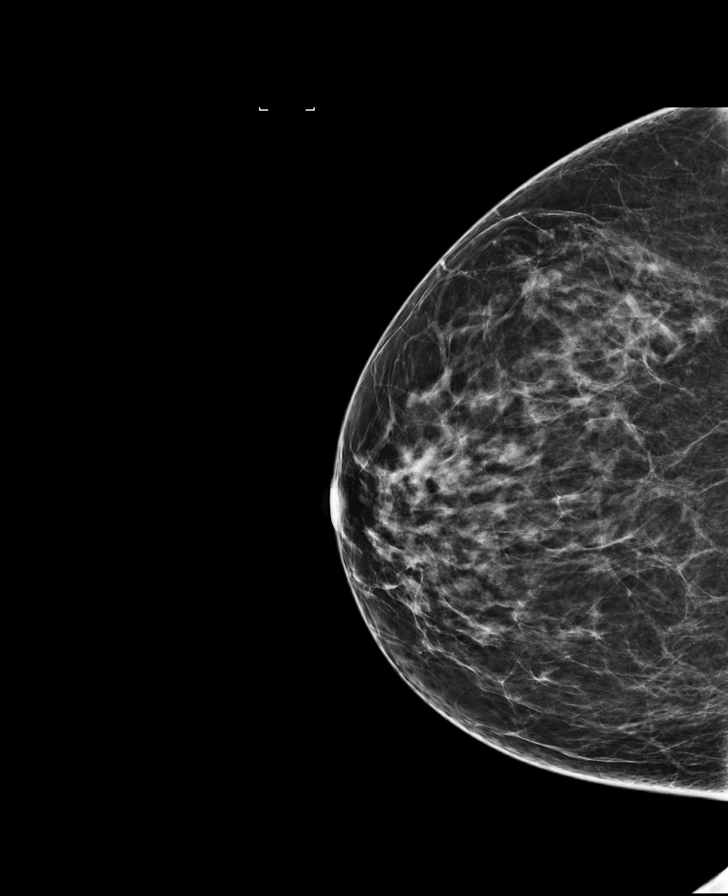

[R MLO synth-2D]
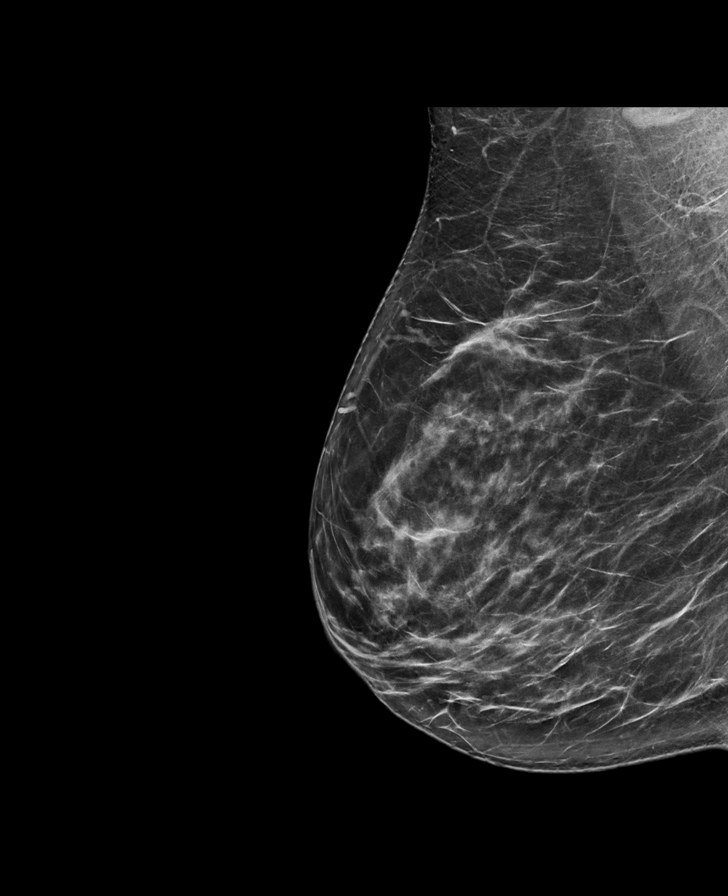

[L MLO synth-2D]
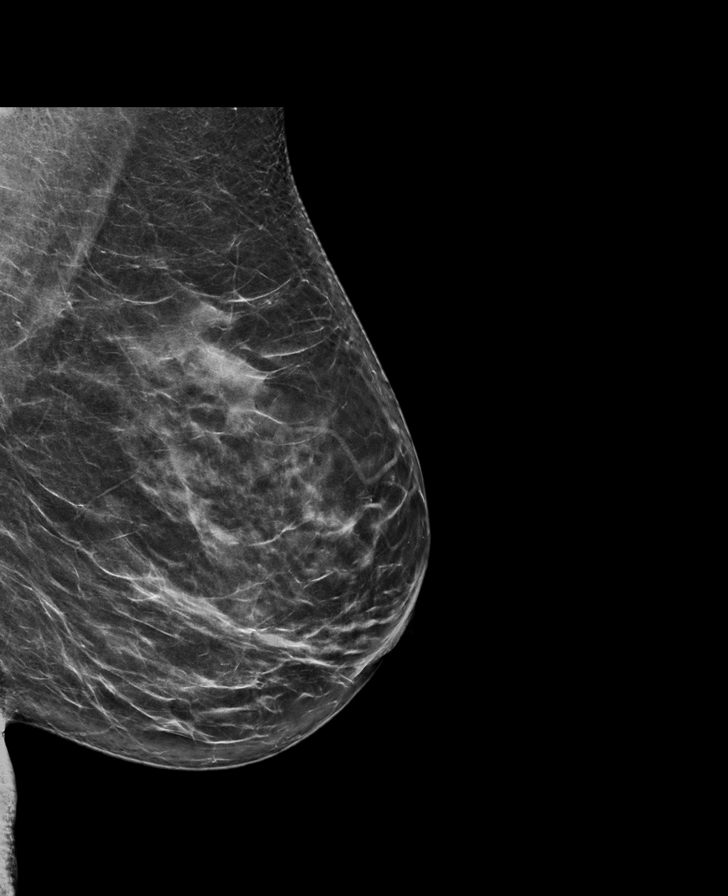

[L CC]
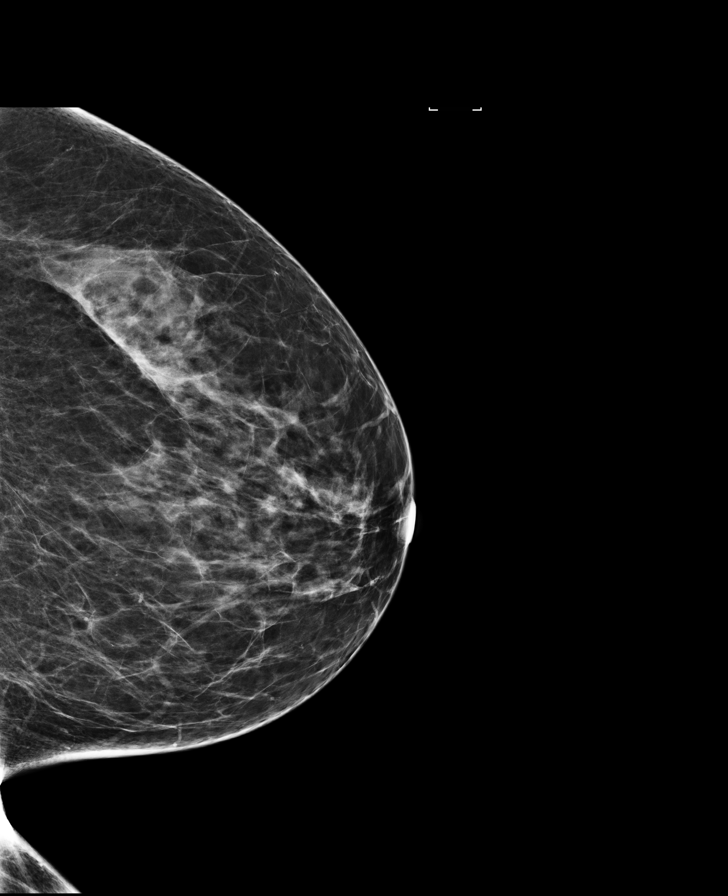

[8 of 28 positions shown; findings below may reference images not displayed]

ACR Breast Density Category b: There are scattered areas of
fibroglandular density.
FINDINGS: There are no findings suspicious for malignancy. Images were
processed with CAD.
IMPRESSION: No mammographic evidence of malignancy. A result letter of this
screening mammogram will be mailed directly to the patient.

RECOMMENDATION:
Screening mammogram in one year. (Code:97-6-RS4)

BI-RADS CATEGORY  1: Negative.

## 2020-06-17 ENCOUNTER — Emergency Department: Payer: BLUE CROSS/BLUE SHIELD

## 2020-06-17 ENCOUNTER — Other Ambulatory Visit: Payer: Self-pay

## 2020-06-17 ENCOUNTER — Inpatient Hospital Stay
Admission: EM | Admit: 2020-06-17 | Discharge: 2020-06-20 | DRG: 287 | Disposition: A | Discharge status: Home / Self Care | Payer: BLUE CROSS/BLUE SHIELD | Attending: Internal Medicine | Admitting: Internal Medicine

## 2020-06-17 DIAGNOSIS — Z8249 Family history of ischemic heart disease and other diseases of the circulatory system: Secondary | ICD-10-CM

## 2020-06-17 DIAGNOSIS — Z79899 Other long term (current) drug therapy: Secondary | ICD-10-CM

## 2020-06-17 DIAGNOSIS — I110 Hypertensive heart disease with heart failure: Secondary | ICD-10-CM | POA: Diagnosis present

## 2020-06-17 DIAGNOSIS — R079 Chest pain, unspecified: Secondary | ICD-10-CM | POA: Diagnosis not present

## 2020-06-17 DIAGNOSIS — Z20822 Contact with and (suspected) exposure to covid-19: Secondary | ICD-10-CM | POA: Diagnosis present

## 2020-06-17 DIAGNOSIS — I249 Acute ischemic heart disease, unspecified: Secondary | ICD-10-CM | POA: Diagnosis not present

## 2020-06-17 DIAGNOSIS — I10 Essential (primary) hypertension: Secondary | ICD-10-CM | POA: Diagnosis not present

## 2020-06-17 DIAGNOSIS — I214 Non-ST elevation (NSTEMI) myocardial infarction: Secondary | ICD-10-CM | POA: Diagnosis present

## 2020-06-17 DIAGNOSIS — I5030 Unspecified diastolic (congestive) heart failure: Secondary | ICD-10-CM | POA: Diagnosis present

## 2020-06-17 DIAGNOSIS — E785 Hyperlipidemia, unspecified: Secondary | ICD-10-CM | POA: Diagnosis present

## 2020-06-17 DIAGNOSIS — I248 Other forms of acute ischemic heart disease: Secondary | ICD-10-CM | POA: Diagnosis not present

## 2020-06-17 DIAGNOSIS — F32A Depression, unspecified: Secondary | ICD-10-CM | POA: Diagnosis present

## 2020-06-17 DIAGNOSIS — Z90710 Acquired absence of both cervix and uterus: Secondary | ICD-10-CM

## 2020-06-17 DIAGNOSIS — I5189 Other ill-defined heart diseases: Secondary | ICD-10-CM

## 2020-06-17 DIAGNOSIS — I252 Old myocardial infarction: Secondary | ICD-10-CM

## 2020-06-17 DIAGNOSIS — I2489 Other forms of acute ischemic heart disease: Secondary | ICD-10-CM | POA: Diagnosis present

## 2020-06-17 LAB — BASIC METABOLIC PANEL
Anion gap: 13 (ref 5–15)
BUN: 24 mg/dL — ABNORMAL HIGH (ref 6–20)
CO2: 23 mmol/L (ref 22–32)
Calcium: 9.9 mg/dL (ref 8.9–10.3)
Chloride: 102 mmol/L (ref 98–111)
Creatinine, Ser: 0.65 mg/dL (ref 0.44–1.00)
GFR, Estimated: >60 mL/min (ref >60)
Glucose, Bld: 98 mg/dL (ref 70–99)
Potassium: 4.1 mmol/L (ref 3.5–5.1)
Sodium: 138 mmol/L (ref 135–145)

## 2020-06-17 LAB — CBC
HCT: 40.4 % (ref 36.0–46.0)
Hemoglobin: 13.4 g/dL (ref 12.0–15.0)
MCH: 28.5 pg (ref 26.0–34.0)
MCHC: 33.2 g/dL (ref 30.0–36.0)
MCV: 86 fL (ref 80.0–100.0)
Platelets: 288 10*3/uL (ref 150–400)
RBC: 4.7 MIL/uL (ref 3.87–5.11)
RDW: 13.1 % (ref 11.5–15.5)
WBC: 5.6 10*3/uL (ref 4.0–10.5)
nRBC: 0 % (ref 0.0–0.2)

## 2020-06-17 LAB — TROPONIN I (HIGH SENSITIVITY)
Troponin I (High Sensitivity): 27 ng/L — ABNORMAL HIGH (ref <18)
Troponin I (High Sensitivity): 349 ng/L (ref <18)
Troponin I (High Sensitivity): 704 ng/L (ref <18)
Troponin I (High Sensitivity): 514 ng/L (ref <18)

## 2020-06-17 LAB — APTT: aPTT: 35 seconds (ref 24–36)

## 2020-06-17 LAB — PROTIME-INR
INR: 1.1 (ref 0.8–1.2)
Prothrombin Time: 13.3 seconds (ref 11.4–15.2)

## 2020-06-17 MED ORDER — SODIUM CHLORIDE 0.9 % IV SOLN: INTRAVENOUS | Status: DC

## 2020-06-17 MED ORDER — ONDANSETRON HCL 4 MG/2ML IJ SOLN: 4.0000 mg | Freq: Four times a day (QID) | INTRAMUSCULAR | Status: DC | PRN

## 2020-06-17 MED ORDER — ASPIRIN EC 325 MG PO TBEC
325.0000 mg | DELAYED_RELEASE_TABLET | Freq: Every day | ORAL | Status: DC
  Administered 2020-06-18 – 2020-06-19 (×2): 325 mg via ORAL
  Filled 2020-06-17 (×2): qty 1

## 2020-06-17 MED ORDER — ACETAMINOPHEN 325 MG PO TABS
650.0000 mg | ORAL_TABLET | ORAL | Status: DC | PRN
  Administered 2020-06-17: 650 mg via ORAL
  Filled 2020-06-17: qty 2

## 2020-06-17 MED ORDER — NITROGLYCERIN 2 % TD OINT
0.5000 [in_us] | TOPICAL_OINTMENT | Freq: Four times a day (QID) | TRANSDERMAL | Status: DC
  Administered 2020-06-17 – 2020-06-20 (×8): 0.5 [in_us] via TOPICAL
  Filled 2020-06-17 (×5): qty 1
  Filled 2020-06-17: qty 14
  Filled 2020-06-17 (×6): qty 1

## 2020-06-17 MED ORDER — HEPARIN BOLUS VIA INFUSION
3500.0000 [IU] | Freq: Once | INTRAVENOUS | Status: AC
  Administered 2020-06-17: 3500 [IU] via INTRAVENOUS
  Filled 2020-06-17: qty 3500

## 2020-06-17 MED ORDER — HEPARIN (PORCINE) 25000 UT/250ML-% IV SOLN
800.0000 [IU]/h | INTRAVENOUS | Status: DC
  Administered 2020-06-17 – 2020-06-19 (×2): 700 [IU]/h via INTRAVENOUS
  Administered 2020-06-20: 800 [IU]/h via INTRAVENOUS
  Filled 2020-06-17 (×3): qty 250

## 2020-06-17 MED ORDER — ROSUVASTATIN CALCIUM 10 MG PO TABS
20.0000 mg | ORAL_TABLET | Freq: Every day | ORAL | Status: DC
  Administered 2020-06-19 – 2020-06-20 (×2): 20 mg via ORAL
  Filled 2020-06-17 (×3): qty 2
  Filled 2020-06-17: qty 1

## 2020-06-17 MED ORDER — ASPIRIN 81 MG PO CHEW
324.0000 mg | CHEWABLE_TABLET | Freq: Once | ORAL | Status: AC
  Administered 2020-06-17: 324 mg via ORAL
  Filled 2020-06-17: qty 4

## 2020-06-17 NOTE — ED Notes (Signed)
First Nurse Note: Pt to ED for chest pain and left arm numbness

## 2020-06-17 NOTE — Consult Note (Signed)
ANTICOAGULATION CONSULT NOTE   Pharmacy Consult for Heparin Indication: chest pain/ACS  No Known Allergies  Patient Measurements: Height: 5\' 4"  (162.6 cm) Weight: 59 kg (130 lb) IBW/kg (Calculated) : 54.7 Heparin Dosing Weight: 59 kg  Vital Signs: Temp: 98.3 F (36.8 C) (02/04 1204) Temp Source: Oral (02/04 1204) BP: 147/73 (02/04 1615) Pulse Rate: 51 (02/04 1645)  Labs: Recent Labs    06/17/20 1209 06/17/20 1415  HGB 13.4  --   HCT 40.4  --   PLT 288  --   CREATININE 0.65  --   TROPONINIHS 27* 349*    Estimated Creatinine Clearance: 64.6 mL/min (by C-G formula based on SCr of 0.65 mg/dL).   Medical History: Past Medical History:  Diagnosis Date  . Depression   . Hypertension     Medications:  (Not in a hospital admission)  Scheduled:  Infusions:  PRN:  Anti-infectives (From admission, onward)   None      Assessment: Pharmacy consulted to start heparin for ACS. Trop 349. No note of DOAC PTA.   Goal of Therapy:  Heparin level 0.3-0.7 units/ml Monitor platelets by anticoagulation protocol: Yes   Plan:  Give 3500 units bolus x 1 Start heparin infusion at 700 units/hr Check anti-Xa level in 6 hours and daily while on heparin Continue to monitor H&H and platelets  08/15/20, PharmD, BCPS 06/17/2020,5:07 PM

## 2020-06-17 NOTE — ED Notes (Signed)
Pt family reports that pts husband recently passed away and symptoms may be related to this. Family also states that has hx/o HTN

## 2020-06-17 NOTE — ED Provider Notes (Signed)
[facility] Emergency Department Provider Note ____________________________________________  I discussed with the patient, offered Spanish interpreter, but she declined this.  Prefers to utilize English at this time   Event Date/Time   First MD Initiated Contact with Patient 06/17/20 1543     (approximate)  I have reviewed the triage vital signs and the nursing notes.  HISTORY  Chief Complaint Chest Pain  HPI A 60 year old patient with a history of hypertension presents for evaluation of chest pain. Initial onset of pain was approximately 3-6 hours ago. [name] chest pain is well-localized, is described as heaviness/pressure/tightness and is not worse with exertion. [name] chest pain is middle- or left-sided, is not sharp and does not radiate to the arms/jaw/neck. The patient does not complain of nausea and denies diaphoresis. The patient has no history of stroke, has no history of peripheral artery disease, has not smoked in the past 90 days, denies any history of treated diabetes, has no relevant family history of coronary artery disease (first degree relative at less than age 93), has no history of hypercholesterolemia and does not have an elevated BMI (>=30).   Pain is presently just very mild, but was fairly intense earlier today but has since subsided  Past Medical History:  Diagnosis Date  . Depression   . Hypertension     Patient Active Problem List   Diagnosis Date Noted  . Enteritis 10/04/2018  . Body mass index (BMI) of 32.0-32.9 in adult 10/29/2017  . Varicose veins of both lower extremities with pain 03/06/2017  . Hyperlipidemia 03/06/2017  . Bilateral lower extremity edema 03/06/2017  . Pure hypercholesterolemia 02/11/2017  . Onychomycosis of toenail 10/20/2016  . Lumbar strain, initial encounter 10/19/2016  . Postmenopausal 10/19/2016  . Prediabetes 10/19/2016  . Chronic insomnia 07/20/2016  . Gastroesophageal reflux  disease without esophagitis 07/20/2016  . Frequent PVCs 08/22/2015  . History of iron deficiency anemia 07/14/2015  . Disequilibrium 03/07/2015  . Benign essential hypertension 02/28/2015  . Intermittent palpitations 02/23/2015    Past Surgical History:  Procedure Laterality Date  . ABDOMINAL HYSTERECTOMY      Prior to Admission medications   Medication Sig Start Date End Date Taking? Authorizing Provider  rosuvastatin (CRESTOR) 20 MG tablet Take 20 mg by mouth daily. 10/20/18   [provider]  telmisartan (MICARDIS) 40 MG tablet Take 40 mg by mouth daily. 10/21/18   [provider]    Allergies Patient has no known allergies.  Family History  Problem Relation Age of Onset  . Breast cancer Maternal Grandmother 12    Social History Social History   Tobacco Use  . Smoking status: Never Smoker  . Smokeless tobacco: Never Used  Substance Use Topics  . Alcohol use: No  . Drug use: No    Review of Systems Constitutional: No fever/chills Eyes: No visual changes. ENT: No sore throat. Cardiovascular: See HPI.  No ripping tearing or moving pain Respiratory: Denies shortness of breath. Gastrointestinal: No abdominal pain.   Genitourinary: Negative for dysuria. Musculoskeletal: Negative for back pain.  No leg swelling.  No history of blood clots. Skin: Negative for rash. Neurological: Negative for headaches, areas of focal weakness or numbness.    ____________________________________________   PHYSICAL EXAM:  VITAL SIGNS: ED Triage Vitals  Enc Vitals Group     BP 06/17/20 1159 (!) 152/84     Pulse Rate 06/17/20 1156 69     Resp 06/17/20 1156 20     Temp 06/17/20 1204  98.3 F (36.8 C)     Temp Source 06/17/20 1204 Oral     SpO2 06/17/20 1159 97 %     Weight 06/17/20 1157 130 lb (59 kg)     Height 06/17/20 1157 5\' 4"  (1.626 m)     Head Circumference --      Peak Flow --      Pain Score 06/17/20 1156 7     Pain Loc --      Pain Edu? --       Excl. in GC? --     Constitutional: Alert and oriented. Well appearing and in no acute distress. Eyes: Conjunctivae are normal. Head: Atraumatic. Nose: No congestion/rhinnorhea. Mouth/Throat: Mucous membranes are moist. Neck: No stridor.  Cardiovascular: Normal rate, regular rhythm. Grossly normal heart sounds.  Good peripheral circulation. Respiratory: Normal respiratory effort.  No retractions. Lungs CTAB. Gastrointestinal: Soft and nontender. No distention. Musculoskeletal: No lower extremity tenderness nor edema. Neurologic:  Normal speech and language. No gross focal neurologic deficits are appreciated.  Skin:  Skin is warm, dry and intact. No rash noted. Psychiatric: Mood and affect are normal. Speech and behavior are normal.  ____________________________________________   LABS (all labs ordered are listed, but only abnormal results are displayed)  Labs Reviewed  BASIC METABOLIC PANEL - Abnormal; Notable for the following components:      Result Value   BUN 24 (*)    All other components within normal limits  TROPONIN I (HIGH SENSITIVITY) - Abnormal; Notable for the following components:   Troponin I (High Sensitivity) 27 (*)    All other components within normal limits  TROPONIN I (HIGH SENSITIVITY) - Abnormal; Notable for the following components:   Troponin I (High Sensitivity) 349 (*)    All other components within normal limits  SARS CORONAVIRUS 2 (TAT 6-24 HRS)  CBC  POC URINE PREG, ED   ____________________________________________  EKG  Reviewed interpreted at noon Heart rate 65 QRS 89 QTc 430 Normal sinus rhythm, nonspecific T wave abnormality, also noticed slight biphasic appearance of T waves in the lateral precordial region.  Slight flattening in V3.  No STEMI.  Mild nonspecific flattening ____________________________________________  RADIOLOGY  Chest x-ray reviewed negative for  acute ____________________________________________   PROCEDURES  Procedure(s) performed: None  Procedures  Critical Care performed: No  ____________________________________________   INITIAL IMPRESSION / ASSESSMENT AND PLAN / ED COURSE  Pertinent labs & imaging results that were available during my care of the patient were reviewed by me and considered in my medical decision making (see chart for details).   Differential diagnosis includes, but is not limited to, ACS, aortic dissection, pulmonary embolism, cardiac tamponade, pneumothorax, pneumonia, pericarditis, myocarditis, GI-related causes including esophagitis/gastritis, and musculoskeletal chest wall pain.    No tachycardia or hypoxia.  No sharp or pleuritic pain.  No ripping tearing or moving pain.  No signs or symptoms suggest pulmonary embolism DVT PE.  I am concerned however about possibility of ACS given rising troponins.  Will treat with salicylates at this time, manage symptomatically and admit to the hospitalist for further care and management.  EKG does not indicate STEMI.  Patient understanding of this plan and need for admission.  She had originally planned to travel to [place] tomorrow, but tells me that she will need to put that on hold which I agree with.  Clinical Course as of 06/17/20 1607  Fri Jun 17, 2020  1518 Troponin I (High Sensitivity)(!!) [NW]    Clinical Course User Index [NW]  [facility], Student-PA   HEAR Score: 3    Given her moderate to mild increase in troponin but improvement in pain, I suspect patient having very mild NSTEMI or possible unstable anginal-like picture.  Savannah Myers was evaluated in Emergency Department on 06/17/2020 for the symptoms described in the history of present illness. She was evaluated in the context of the global COVID-19 pandemic, which necessitated consideration that the patient might be at risk for infection with the SARS-CoV-2 virus that causes COVID-19.  Institutional protocols and algorithms that pertain to the evaluation of patients at risk for COVID-19 are in a state of rapid change based on information released by regulatory bodies including the CDC and federal and state organizations. These policies and algorithms were followed during the patient's care in the ED.  ____________________________________________   FINAL CLINICAL IMPRESSION(S) / ED DIAGNOSES  Final diagnoses:  ACS (acute coronary syndrome) [facility])        Note:  This document was prepared using Dragon voice recognition software and may include unintentional dictation errors       Sharyn Creamer, MD 06/17/20 1608

## 2020-06-17 NOTE — H&P (Signed)
TRH H&P    Patient Demographics:    Savannah Myers, is a 60 y.o. female  MRN: [id number]  DOB - 01-07-1961  Admit Date - 06/17/2020  Referring MD/NP/PA: Dr. Fanny Bien  Outpatient Primary MD for the patient is Gracelyn Nurse, MD  Patient coming from: Home  Chief complaint-chest pain   HPI:    Savannah Myers  is a 60 y.o. female, with medical history of hypertension, hyperlipidemia who is followed by [facility] clinic cardiology came to hospital with chest pain.  Chest pain started this morning, felt like pressure.  No radiation of pain.  She denies nausea vomiting or diarrhea.  Denies shortness of breath.  She does not have previous history of CAD.  Pain is persistent, rates 7/10 intensity. In the ED EKG showed nonspecific ST changes in leads II, III, aVF, V5 V6.  Troponin initially was 27,>> 349.  Patient was given aspirin 325 mg x 1. She denies previous history of stroke or seizures. Denies fever or chills. Denies abdominal pain or dysuria No previous history of CAD Does have positive family history of CAD.    Review of systems:    In addition to the HPI above,    All other systems reviewed and are negative.    Past History of the following :    Past Medical History:  Diagnosis Date  . Depression   . Hypertension       Past Surgical History:  Procedure Laterality Date  . ABDOMINAL HYSTERECTOMY        Social History:      Social History   Tobacco Use  . Smoking status: Never Smoker  . Smokeless tobacco: Never Used  Substance Use Topics  . Alcohol use: No       Family History :     Family History  Problem Relation Age of Onset  . Breast cancer Maternal Grandmother 29   Patient's mother and brother have coronary artery disease   Home Medications:   Prior to Admission medications   Medication Sig Start Date End Date Taking? Authorizing Provider  rosuvastatin  (CRESTOR) 20 MG tablet Take 20 mg by mouth daily. 10/20/18   [provider]  telmisartan (MICARDIS) 40 MG tablet Take 40 mg by mouth daily. 10/21/18   [provider]     Allergies:    No Known Allergies   Physical Exam:   Vitals  Blood pressure (!) 147/73, pulse (!) 51, temperature 98.3 F (36.8 C), temperature source Oral, resp. rate 16, height 5\' 4"  (1.626 m), weight 59 kg, SpO2 99 %.  1.  General: Appears in no acute distress  2. Psychiatric: Alert , Oriented x3, intact insight and judgment  3. Neurologic: Cranial nerves II through XII grossly intact, no focal deficit noted  4. HEENMT:  Atraumatic normocephalic, extraocular muscles are intact  5. Respiratory : Clear to auscultation bilaterally, no wheezing or crackles auscultated  6. Cardiovascular : S1-S2, regular, no murmur auscultated  7. Gastrointestinal:  Abdomen is soft, nontender, no organomegaly  8. Skin:  No  rashes noted      Data Review:    CBC Recent Labs  Lab 06/17/20 1209  WBC 5.6  HGB 13.4  HCT 40.4  PLT 288  MCV 86.0  MCH 28.5  MCHC 33.2  RDW 13.1   ------------------------------------------------------------------------------------------------------------------  Results for orders placed or performed during the hospital encounter of 06/17/20 (from the past 48 hour(s))  Basic metabolic panel     Status: Abnormal   Collection Time: 06/17/20 12:09 PM  Result Value Ref Range   Sodium 138 135 - 145 mmol/L   Potassium 4.1 3.5 - 5.1 mmol/L   Chloride 102 98 - 111 mmol/L   CO2 23 22 - 32 mmol/L   Glucose, Bld 98 70 - 99 mg/dL    Comment: Glucose reference range applies only to samples taken after fasting for at least 8 hours.   BUN 24 (H) 6 - 20 mg/dL   Creatinine, Ser 0.86 0.44 - 1.00 mg/dL   Calcium 9.9 8.9 - 76.1 mg/dL   GFR, Estimated >95 >09 mL/min    Comment: (NOTE) Calculated using the CKD-EPI Creatinine Equation (2021)    Anion gap 13 5 - 15    Comment:  Performed at [facility], [address] [place], [place] [address]  CBC     Status: None   Collection Time: 06/17/20 12:09 PM  Result Value Ref Range   WBC 5.6 4.0 - 10.5 K/uL   RBC 4.70 3.87 - 5.11 MIL/uL   Hemoglobin 13.4 12.0 - 15.0 g/dL   HCT 24.5 80.9 - 98.3 %   MCV 86.0 80.0 - 100.0 fL   MCH 28.5 26.0 - 34.0 pg   MCHC 33.2 30.0 - 36.0 g/dL   RDW 38.2 50.5 - 39.7 %   Platelets 288 150 - 400 K/uL   nRBC 0.0 0.0 - 0.2 %    Comment: Performed at [facility], [address], [place], [place] [address]  Troponin I (High Sensitivity)     Status: Abnormal   Collection Time: 06/17/20 12:09 PM  Result Value Ref Range   Troponin I (High Sensitivity) 27 (H) <18 ng/L    Comment: (NOTE) Elevated high sensitivity troponin I (hsTnI) values and significant  changes across serial measurements may suggest ACS but many other  chronic and acute conditions are known to elevate hsTnI results.  Refer to the "Links" section for chest pain algorithms and additional  guidance. Performed at [facility], [address] [place], [place] [address]   Troponin I (High Sensitivity)     Status: Abnormal   Collection Time: 06/17/20  2:15 PM  Result Value Ref Range   Troponin I (High Sensitivity) 349 (HH) <18 ng/L    Comment: CRITICAL RESULT CALLED TO, READ BACK BY AND VERIFIED WITH TIFFANY JOHNSON AT 1458 06/17/20.PMF (NOTE) Elevated high sensitivity troponin I (hsTnI) values and significant  changes across serial measurements may suggest ACS but many other  chronic and acute conditions are known to elevate hsTnI results.  Refer to the "Links" section for chest pain algorithms and additional  guidance. Performed at [facility], [address] [place], [place] [address]     Chemistries  Recent Labs  Lab 06/17/20 1209  NA 138  K 4.1  CL 102  CO2 23  GLUCOSE 98  BUN 24*  CREATININE 0.65  CALCIUM 9.9     --------------------------------------------------------------------------------------------------------------- Urine analysis:    Component Value Date/Time   COLORURINE YELLOW (A) 10/04/2018 0326   APPEARANCEUR CLOUDY (  A) 10/04/2018 0326   LABSPEC 1.022 10/04/2018 0326   PHURINE 9.0 (H) 10/04/2018 0326   GLUCOSEU NEGATIVE 10/04/2018 0326   HGBUR NEGATIVE 10/04/2018 0326   BILIRUBINUR NEGATIVE 10/04/2018 0326   KETONESUR 5 (A) 10/04/2018 0326   PROTEINUR >=300 (A) 10/04/2018 0326   NITRITE NEGATIVE 10/04/2018 0326   LEUKOCYTESUR LARGE (A) 10/04/2018 0326      Imaging Results:    DG Chest 2 View  Result Date: 06/17/2020 CLINICAL DATA:  Chest pain radiating to LEFT arm a started within the last hour. EXAM: CHEST - 2 VIEW COMPARISON:  December 26, 2017 FINDINGS: Trachea midline. Cardiomediastinal contours and hilar structures are normal. Lungs are clear. On limited assessment no acute skeletal process. IMPRESSION: No active cardiopulmonary disease. Electronically Signed   By: Donzetta Kohut M.D.   On: 06/17/2020 12:39    My personal review of EKG: Rhythm NSR, nonspecific ST changes noted in leads II, III, aVF, V5 V6   Assessment & Plan:    Active Problems:   ACS (acute coronary syndrome) (HCC)   1. NSTEMI-patient presented with chest pain, troponin went up 27>> 349.  She has persistent pain, EKG shows nonspecific ST changes.  Will start patient on heparin per pharmacy for acute coronary syndrome, aspirin 325 mg daily, Nitropaste 0.5 inches every 6 hours.  I called and discussed with cardiology Dr. Darrold Junker, who agrees with the plan.  He will see patient in AM.  No indication for urgent cardiac cath.  We will continue to trend troponin.  Will monitor patient closely on telemetry.  Patient can go to telemetry floor as per cardiology. 2. Hypertension-we will hold Micardis.  Patient already started Nitropaste as above.  Blood pressure is controlled.  Would avoid beta-blocker due to  bradycardia. 3. Hyperlipidemia-continue Crestor, will check lipid profile in a.m.   DVT Prophylaxis-   Heparin  AM Labs Ordered, also please review Full Orders  Family Communication: Admission, patients condition and plan of care including tests being ordered have been discussed with the patient  who indicate understanding and agree with the plan and Code Status.  Code Status: Full code  Admission status: Observation     [name] presenting symptoms include home The worrisome physical exam findings include chest pain. The initial radiographic and laboratory data are worrisome because of NSTEMI The chronic co-morbidities include hypertension, hyperlipidemia.       * I certify that at the point of admission it is my clinical judgment that the patient will require inpatient hospital care spanning beyond 2 midnights from the point of admission due to high intensity of service, high risk for further deterioration and high frequency of surveillance required.*  Time spent in minutes : 60 minutes   Anhar Mcdermott S Uriah Philipson M.D

## 2020-06-17 NOTE — ED Triage Notes (Signed)
Pt to ED POV for CP radiating to left arm that started within last hour. Pt very tearful in triage holding chest +SHOB Denies N/V RR unlabored Reports hx tachycardia

## 2020-06-17 NOTE — ED Notes (Signed)
Sent floor message regarding pt  

## 2020-06-18 ENCOUNTER — Encounter: Payer: Self-pay | Admitting: Family Medicine

## 2020-06-18 ENCOUNTER — Observation Stay
Admit: 2020-06-18 | Discharge: 2020-06-18 | Disposition: A | Discharge status: Home / Self Care | Payer: BLUE CROSS/BLUE SHIELD | Attending: Family Medicine | Admitting: Family Medicine

## 2020-06-18 ENCOUNTER — Other Ambulatory Visit: Payer: Self-pay

## 2020-06-18 DIAGNOSIS — I249 Acute ischemic heart disease, unspecified: Secondary | ICD-10-CM | POA: Diagnosis not present

## 2020-06-18 DIAGNOSIS — I110 Hypertensive heart disease with heart failure: Secondary | ICD-10-CM | POA: Diagnosis present

## 2020-06-18 DIAGNOSIS — E785 Hyperlipidemia, unspecified: Secondary | ICD-10-CM

## 2020-06-18 DIAGNOSIS — R079 Chest pain, unspecified: Secondary | ICD-10-CM | POA: Diagnosis present

## 2020-06-18 DIAGNOSIS — I248 Other forms of acute ischemic heart disease: Secondary | ICD-10-CM | POA: Diagnosis present

## 2020-06-18 DIAGNOSIS — I252 Old myocardial infarction: Secondary | ICD-10-CM | POA: Diagnosis not present

## 2020-06-18 DIAGNOSIS — I214 Non-ST elevation (NSTEMI) myocardial infarction: Secondary | ICD-10-CM | POA: Diagnosis present

## 2020-06-18 DIAGNOSIS — Z79899 Other long term (current) drug therapy: Secondary | ICD-10-CM | POA: Diagnosis not present

## 2020-06-18 DIAGNOSIS — Z90710 Acquired absence of both cervix and uterus: Secondary | ICD-10-CM | POA: Diagnosis not present

## 2020-06-18 DIAGNOSIS — I5189 Other ill-defined heart diseases: Secondary | ICD-10-CM | POA: Diagnosis not present

## 2020-06-18 DIAGNOSIS — I10 Essential (primary) hypertension: Secondary | ICD-10-CM | POA: Diagnosis not present

## 2020-06-18 DIAGNOSIS — I5030 Unspecified diastolic (congestive) heart failure: Secondary | ICD-10-CM | POA: Diagnosis present

## 2020-06-18 DIAGNOSIS — Z8249 Family history of ischemic heart disease and other diseases of the circulatory system: Secondary | ICD-10-CM | POA: Diagnosis not present

## 2020-06-18 DIAGNOSIS — F32A Depression, unspecified: Secondary | ICD-10-CM | POA: Diagnosis present

## 2020-06-18 DIAGNOSIS — Z20822 Contact with and (suspected) exposure to covid-19: Secondary | ICD-10-CM | POA: Diagnosis present

## 2020-06-18 LAB — ECHOCARDIOGRAM COMPLETE
AR max vel: 1.74 cm2
AV Peak grad: 9.1 mmHg
Ao pk vel: 1.51 m/s
Area-P 1/2: 2.52 cm2
Height: 64 in
S' Lateral: 3.03 cm
Weight: 2056 oz

## 2020-06-18 LAB — LIPID PANEL
Cholesterol: 170 mg/dL (ref 0–200)
HDL: 58 mg/dL (ref >40)
LDL Cholesterol: 103 mg/dL — ABNORMAL HIGH (ref 0–99)
Total CHOL/HDL Ratio: 2.9 RATIO
Triglycerides: 43 mg/dL (ref <150)
VLDL: 9 mg/dL (ref 0–40)

## 2020-06-18 LAB — HIV ANTIBODY (ROUTINE TESTING W REFLEX): HIV Screen 4th Generation wRfx: NONREACTIVE

## 2020-06-18 LAB — HEPARIN LEVEL (UNFRACTIONATED)
Heparin Unfractionated: 0.35 IU/mL (ref 0.30–0.70)
Heparin Unfractionated: 0.44 IU/mL (ref 0.30–0.70)

## 2020-06-18 LAB — SARS CORONAVIRUS 2 (TAT 6-24 HRS): SARS Coronavirus 2: NEGATIVE

## 2020-06-18 MED ORDER — IRBESARTAN 150 MG PO TABS
75.0000 mg | ORAL_TABLET | Freq: Every day | ORAL | Status: DC
  Administered 2020-06-18 – 2020-06-19 (×2): 75 mg via ORAL
  Filled 2020-06-18 (×2): qty 1

## 2020-06-18 MED ORDER — METOPROLOL TARTRATE 25 MG PO TABS: 12.5000 mg | ORAL_TABLET | Freq: Two times a day (BID) | ORAL | Status: DC

## 2020-06-18 MED ORDER — SODIUM CHLORIDE 0.9% FLUSH
3.0000 mL | Freq: Two times a day (BID) | INTRAVENOUS | Status: DC
  Administered 2020-06-18 – 2020-06-20 (×4): 3 mL via INTRAVENOUS

## 2020-06-18 NOTE — Consult Note (Signed)
ANTICOAGULATION CONSULT NOTE   Pharmacy Consult for Heparin Indication: chest pain/ACS  No Known Allergies  Patient Measurements: Height: 5\' 4"  (162.6 cm) Weight: 59 kg (130 lb) IBW/kg (Calculated) : 54.7 Heparin Dosing Weight: 59 kg  Vital Signs: Temp: 98.5 F (36.9 C) (02/04 2233) Temp Source: Oral (02/04 2233) BP: 151/70 (02/04 2233) Pulse Rate: 52 (02/04 2233)  Labs: Recent Labs    06/17/20 1209 06/17/20 1415 06/17/20 1717 06/17/20 1824 06/17/20 2139 06/18/20 0018  HGB 13.4  --   --   --   --   --   HCT 40.4  --   --   --   --   --   PLT 288  --   --   --   --   --   APTT  --   --  35  --   --   --   LABPROT  --   --  13.3  --   --   --   INR  --   --  1.1  --   --   --   HEPARINUNFRC  --   --   --   --   --  0.35  CREATININE 0.65  --   --   --   --   --   TROPONINIHS 27* 349*  --  704* 514*  --     Estimated Creatinine Clearance: 64.6 mL/min (by C-G formula based on SCr of 0.65 mg/dL).   Medical History: Past Medical History:  Diagnosis Date  . Depression   . Hypertension     Medications:  Medications Prior to Admission  Medication Sig Dispense Refill Last Dose  . melatonin 3 MG TABS tablet Take 6 mg by mouth at bedtime.   06/16/2020 at Unknown time  . telmisartan (MICARDIS) 40 MG tablet Take 40 mg by mouth daily.   06/17/2020 at 0630  . rosuvastatin (CRESTOR) 20 MG tablet Take 20 mg by mouth daily. (Patient not taking: No sig reported)   Not Taking at Unknown time   Scheduled:  Infusions:  PRN:  Anti-infectives (From admission, onward)   None      Assessment: Pharmacy consulted to start heparin for ACS. Trop 349. No note of DOAC PTA.   Goal of Therapy:  Heparin level 0.3-0.7 units/ml Monitor platelets by anticoagulation protocol: Yes   Plan:  2/5:  HL @ 0018 = 0.35 Will continue pt on current rate and draw confirmation level on 2/5 @ 0600.   Rami Waddle D, PharmD 06/18/2020,1:16 AM

## 2020-06-18 NOTE — Progress Notes (Addendum)
Progress Note    Savannah Myers  GYI:[id number] DOB: [birth year]  DOA: 06/17/2020 PCP: Gracelyn Nurse, MD      Brief Narrative:    Medical records reviewed and are as summarized below:  Savannah Myers is a 60 y.o. female with medical history significant for hypertension, hyperlipidemia, depression, presented to the hospital with chest pain.  Her troponins were elevated and she was admitted to the hospital for acute non-ST elevation MI.  She was treated with IV heparin infusion, topical nitrates, rosuvastatin, aspirin and metoprolol.  Cardiologist was consulted to assist with management.      Assessment/Plan:   Principal Problem:   ACS (acute coronary syndrome) (HCC) Active Problems:   Hyperlipidemia   Benign essential hypertension   NSTEMI (non-ST elevated myocardial infarction) (HCC)   Body mass index is 22.06 kg/m.    Acute NSTEMI: Continue IV heparin infusion.  Monitor heparin level per protocol.  Continue aspirin, Crestor, metoprolol and rosuvastatin.  Discontinue IV fluids.  Plan for left heart cath on 06/20/2020.  Follow-up with cardiologist.  Hypertension: Continue antihypertensives  Hyperlipidemia: Continue rosuvastatin  I called his son Jonetta Speak, at [phone] but there was no response.  Diet Order            Diet Heart Room service appropriate? Yes; Fluid consistency: Thin  Diet effective now                    Consultants:  Cardiologist  Procedures:  None    Medications:   . aspirin EC  325 mg Oral Daily  . irbesartan  75 mg Oral Daily  . metoprolol tartrate  12.5 mg Oral BID  . nitroGLYCERIN  0.5 inch Topical Q6H  . rosuvastatin  20 mg Oral Daily  . sodium chloride flush  3 mL Intravenous Q12H   Continuous Infusions: . heparin 700 Units/hr (06/18/20 1507)     Anti-infectives (From admission, onward)   None             Family Communication/Anticipated D/C date and plan/Code Status   DVT prophylaxis:       Code Status: Full Code  Family Communication: None Disposition Plan:    Status is: Observation  The patient will require care spanning > 2 midnights and should be moved to inpatient because: IV treatments appropriate due to intensity of illness or inability to take PO and Inpatient level of care appropriate due to severity of illness  Dispo: The patient is from: Home              Anticipated d/c is to: Home              Anticipated d/c date is: 3 days              Patient currently is not medically stable to d/c.   Difficult to place patient No           Subjective:    She had minimal chest pain this morning which she graded as 2/10 in severity but she feels better now.  Objective:    Vitals:   06/18/20 0223 06/18/20 0454 06/18/20 0816 06/18/20 1224  BP:  117/64 120/65 117/61  Pulse:  (!) 47 (!) 52 (!) 55  Resp:  17    Temp:  97.9 F (36.6 C) 98.4 F (36.9 C) 98.6 F (37 C)  TempSrc:  Oral Oral Oral  SpO2:  100% 95%   Weight: 58.3 kg  Height:       No data found.   Intake/Output Summary (Last 24 hours) at 06/18/2020 1550 Last data filed at 06/18/2020 1507 Gross per 24 hour  Intake 1120.75 ml  Output --  Net 1120.75 ml   Filed Weights   06/17/20 1157 06/18/20 0223  Weight: 59 kg 58.3 kg    Exam:  GEN: NAD SKIN: No rash EYES: EOMI ENT: MMM CV: RRR PULM: CTA B ABD: soft, ND, NT, +BS CNS: AAO x 3, non focal EXT: No edema or tenderness        Data Reviewed:   I have personally reviewed following labs and imaging studies:  Labs: Labs show the following:   Basic Metabolic Panel: Recent Labs  Lab 06/17/20 1209  NA 138  K 4.1  CL 102  CO2 23  GLUCOSE 98  BUN 24*  CREATININE 0.65  CALCIUM 9.9   GFR Estimated Creatinine Clearance: 64.6 mL/min (by C-G formula based on SCr of 0.65 mg/dL). Liver Function Tests: No results for input(s): AST, ALT, ALKPHOS, BILITOT, PROT, ALBUMIN in the last 168 hours. No results for  input(s): LIPASE, AMYLASE in the last 168 hours. No results for input(s): AMMONIA in the last 168 hours. Coagulation profile Recent Labs  Lab 06/17/20 1717  INR 1.1    CBC: Recent Labs  Lab 06/17/20 1209  WBC 5.6  HGB 13.4  HCT 40.4  MCV 86.0  PLT 288   Cardiac Enzymes: No results for input(s): CKTOTAL, CKMB, CKMBINDEX, TROPONINI in the last 168 hours. BNP (last 3 results) No results for input(s): PROBNP in the last 8760 hours. CBG: No results for input(s): GLUCAP in the last 168 hours. D-Dimer: No results for input(s): DDIMER in the last 72 hours. Hgb A1c: No results for input(s): HGBA1C in the last 72 hours. Lipid Profile: Recent Labs    06/18/20 0018  CHOL 170  HDL 58  LDLCALC 103*  TRIG 43  CHOLHDL 2.9   Thyroid function studies: No results for input(s): TSH, T4TOTAL, T3FREE, THYROIDAB in the last 72 hours.  Invalid input(s): FREET3 Anemia work up: No results for input(s): VITAMINB12, FOLATE, FERRITIN, TIBC, IRON, RETICCTPCT in the last 72 hours. Sepsis Labs: Recent Labs  Lab 06/17/20 1209  WBC 5.6    Microbiology Recent Results (from the past 240 hour(s))  SARS CORONAVIRUS 2 (TAT 6-24 HRS) Nasopharyngeal Nasopharyngeal Swab     Status: None   Collection Time: 06/17/20  4:02 PM   Specimen: Nasopharyngeal Swab  Result Value Ref Range Status   SARS Coronavirus 2 NEGATIVE NEGATIVE Final    Comment: (NOTE) SARS-CoV-2 target nucleic acids are NOT DETECTED.  The SARS-CoV-2 RNA is generally detectable in upper and lower respiratory specimens during the acute phase of infection. Negative results do not preclude SARS-CoV-2 infection, do not rule out co-infections with other pathogens, and should not be used as the sole basis for treatment or other patient management decisions. Negative results must be combined with clinical observations, patient history, and epidemiological information. The expected result is Negative.  Fact Sheet for  Patients: HairSlick.no  Fact Sheet for Healthcare Providers: quierodirigir.com  This test is not yet approved or cleared by the [place] FDA and  has been authorized for detection and/or diagnosis of SARS-CoV-2 by FDA under an Emergency Use Authorization (EUA). This EUA will remain  in effect (meaning this test can be used) for the duration of the COVID-19 declaration under Se ction 564(b)(1) of the Act, 21 U.S.C. section 360bbb-3(b)(1), unless the  authorization is terminated or revoked sooner.  Performed at [facility], [address], [place], [place] [address]     Procedures and diagnostic studies:  DG Chest 2 View  Result Date: 06/17/2020 CLINICAL DATA:  Chest pain radiating to LEFT arm a started within the last hour. EXAM: CHEST - 2 VIEW COMPARISON:  December 26, 2017 FINDINGS: Trachea midline. Cardiomediastinal contours and hilar structures are normal. Lungs are clear. On limited assessment no acute skeletal process. IMPRESSION: No active cardiopulmonary disease. Electronically Signed   By: Donzetta Kohut M.D.   On: 06/17/2020 12:39   ECHOCARDIOGRAM COMPLETE  Result Date: 06/18/2020    ECHOCARDIOGRAM REPORT   Patient Name:   NAYDELIN ZIEGLER Date of Exam: 06/18/2020 Medical Rec #:  [id number]           Height:       64.0 in Accession #:    [id number]          Weight:       128.5 lb Date of Birth:  July 21, 1960            BSA:          1.621 m Patient Age:    59 years            BP:           117/64 mmHg Patient Gender: F                   HR:           60 bpm. Exam Location:  [facility] Procedure: 2D Echo Indications:     NSTEMI I21.4  History:         Patient has no prior history of Echocardiogram examinations.  Sonographer:     Wonda Cerise RDCS Referring Phys:  Gomez Cleverly LAMA Diagnosing Phys: Marcina Millard MD IMPRESSIONS  1. Left ventricular ejection fraction, by estimation, is 60 to 65%. The left ventricle has normal  function. The left ventricle has no regional wall motion abnormalities. Left ventricular diastolic parameters are consistent with Grade I diastolic dysfunction (impaired relaxation).  2. Right ventricular systolic function is normal. The right ventricular size is normal.  3. The mitral valve is normal in structure. Trivial mitral valve regurgitation. No evidence of mitral stenosis.  4. The aortic valve is normal in structure. Aortic valve regurgitation is not visualized. No aortic stenosis is present.  5. The inferior vena cava is normal in size with greater than 50% respiratory variability, suggesting right atrial pressure of 3 mmHg. FINDINGS  Left Ventricle: Left ventricular ejection fraction, by estimation, is 60 to 65%. The left ventricle has normal function. The left ventricle has no regional wall motion abnormalities. The left ventricular internal cavity size was normal in size. There is  no left ventricular hypertrophy. Left ventricular diastolic parameters are consistent with Grade I diastolic dysfunction (impaired relaxation). Right Ventricle: The right ventricular size is normal. No increase in right ventricular wall thickness. Right ventricular systolic function is normal. Left Atrium: Left atrial size was normal in size. Right Atrium: Right atrial size was normal in size. Pericardium: There is no evidence of pericardial effusion. Mitral Valve: The mitral valve is normal in structure. Trivial mitral valve regurgitation. No evidence of mitral valve stenosis. Tricuspid Valve: The tricuspid valve is normal in structure. Tricuspid valve regurgitation is trivial. No evidence of tricuspid stenosis. Aortic Valve: The aortic valve is normal in structure. Aortic valve regurgitation is not visualized. No aortic  stenosis is present. Aortic valve peak gradient measures 9.1 mmHg. Pulmonic Valve: The pulmonic valve was normal in structure. Pulmonic valve regurgitation is not visualized. No evidence of pulmonic  stenosis. Aorta: The aortic root is normal in size and structure. Venous: The inferior vena cava is normal in size with greater than 50% respiratory variability, suggesting right atrial pressure of 3 mmHg. IAS/Shunts: No atrial level shunt detected by color flow Doppler.  LEFT VENTRICLE PLAX 2D LVIDd:         4.73 cm  Diastology LVIDs:         3.03 cm  LV e' medial:   5.98 cm/s LV PW:         0.89 cm  LV E/e' medial: 8.4 LV IVS:        0.93 cm LVOT diam:     1.80 cm LV SV:         51 LV SV Index:   31 LVOT Area:     2.54 cm  RIGHT VENTRICLE RV Basal diam:  2.45 cm RV S prime:     16.30 cm/s TAPSE (M-mode): 2.5 cm LEFT ATRIUM             Index       RIGHT ATRIUM           Index LA diam:        3.60 cm 2.22 cm/m  RA Area:     12.10 cm LA Vol (A2C):   28.8 ml 17.77 ml/m RA Volume:   26.00 ml  16.04 ml/m LA Vol (A4C):   25.5 ml 15.73 ml/m LA Biplane Vol: 27.3 ml 16.84 ml/m  AORTIC VALVE                PULMONIC VALVE AV Area (Vmax): 1.74 cm    PV Vmax:       0.86 m/s AV Vmax:        151.00 cm/s PV Peak grad:  2.9 mmHg AV Peak Grad:   9.1 mmHg LVOT Vmax:      103.00 cm/s LVOT Vmean:     59.300 cm/s LVOT VTI:       0.200 m  AORTA Ao Root diam: 2.60 cm Ao Asc diam:  2.60 cm MITRAL VALVE MV Area (PHT): 2.52 cm    SHUNTS MV Decel Time: 301 msec    Systemic VTI:  0.20 m MV E velocity: 50.10 cm/s  Systemic Diam: 1.80 cm MV A velocity: 78.00 cm/s MV E/A ratio:  0.64 Marcina Millard MD Electronically signed by Marcina Millard MD Signature Date/Time: 06/18/2020/2:37:06 PM    Final                LOS: 0 days   Najla Aughenbaugh  [facility]   Pager on [email or web address]. If 7PM-7AM, please contact night-coverage at [email or web address]     06/18/2020, 3:50 PM

## 2020-06-18 NOTE — Consult Note (Signed)
ANTICOAGULATION CONSULT NOTE   Pharmacy Consult for Heparin Indication: chest pain/ACS  No Known Allergies  Patient Measurements: Height: 5\' 4"  (162.6 cm) Weight: 58.3 kg (128 lb 8 oz) IBW/kg (Calculated) : 54.7 Heparin Dosing Weight: 59 kg  Vital Signs: Temp: 97.9 F (36.6 C) (02/05 0454) Temp Source: Oral (02/05 0454) BP: 117/64 (02/05 0454) Pulse Rate: 47 (02/05 0454)  Labs: Recent Labs    06/17/20 1209 06/17/20 1415 06/17/20 1717 06/17/20 1824 06/17/20 2139 06/18/20 0018 06/18/20 0553  HGB 13.4  --   --   --   --   --   --   HCT 40.4  --   --   --   --   --   --   PLT 288  --   --   --   --   --   --   APTT  --   --  35  --   --   --   --   LABPROT  --   --  13.3  --   --   --   --   INR  --   --  1.1  --   --   --   --   HEPARINUNFRC  --   --   --   --   --  0.35 0.44  CREATININE 0.65  --   --   --   --   --   --   TROPONINIHS 27* 349*  --  704* 514*  --   --     Estimated Creatinine Clearance: 64.6 mL/min (by C-G formula based on SCr of 0.65 mg/dL).   Medical History: Past Medical History:  Diagnosis Date  . Depression   . Hypertension     Medications:  Medications Prior to Admission  Medication Sig Dispense Refill Last Dose  . melatonin 3 MG TABS tablet Take 6 mg by mouth at bedtime.   06/16/2020 at Unknown time  . telmisartan (MICARDIS) 40 MG tablet Take 40 mg by mouth daily.   06/17/2020 at 0630  . rosuvastatin (CRESTOR) 20 MG tablet Take 20 mg by mouth daily. (Patient not taking: No sig reported)   Not Taking at Unknown time   Scheduled:  Infusions:  PRN:  Anti-infectives (From admission, onward)   None      Assessment: Pharmacy consulted to start heparin for ACS. Trop 349. No note of DOAC PTA.   Goal of Therapy:  Heparin level 0.3-0.7 units/ml Monitor platelets by anticoagulation protocol: Yes   Plan:  2/5:  HL @ 0018 = 0.35 Will continue pt on current rate and draw confirmation level on 2/5 @ 0600.   2/5:  HL @ 0553 = 0.44 ,  therapeutic X 2  Will continue pt on current rate and recheck HL on 2/6 with AM labs.   Jahdiel Krol D, PharmD 06/18/2020,6:30 AM

## 2020-06-18 NOTE — Progress Notes (Signed)
*  PRELIMINARY RESULTS* Echocardiogram 2D Echocardiogram has been performed.  Garrel Ridgel Ginelle Bays 06/18/2020, 11:32 AM

## 2020-06-18 NOTE — Consult Note (Addendum)
[facility] Cardiology  CARDIOLOGY CONSULT NOTE  Patient ID: Savannah Myers MRN: [id number] DOB/AGE: 06/01/1960 60 y.o.  Admit date: 06/17/2020 Referring Physician Myriam Forehand Primary Physician [facility] Cardiologist Gwen Pounds Reason for Consultation non-ST elevation myocardial infarction  HPI: 60 year old female referred for evaluation of new onset chest pain and elevated troponin.  She has a history of essential hypertension and hyperlipidemia.  He was in her usual state of health until 06/17/2020 when she experienced substernal chest discomfort, described as pressure-like sensation, without associated nausea, vomiting, diaphoresis, radiation, or shortness of breath.  In the ED, ECG revealed sinus rhythm at 68 bpm, with possible old anteroseptal MI, with nonspecific T wave abnormalities.  Patient was started on topical nitrates and heparin drip with eventual resolution of symptoms.  Patient has ruled in for non-ST elevation myocardial infarction with high-sensitivity troponin 27, 349, 704, 514.  Review of systems complete and found to be negative unless listed above     Past Medical History:  Diagnosis Date  . Depression   . Hypertension     Past Surgical History:  Procedure Laterality Date  . ABDOMINAL HYSTERECTOMY      Medications Prior to Admission  Medication Sig Dispense Refill Last Dose  . melatonin 3 MG TABS tablet Take 6 mg by mouth at bedtime.   06/16/2020 at Unknown time  . telmisartan (MICARDIS) 40 MG tablet Take 40 mg by mouth daily.   06/17/2020 at 0630  . rosuvastatin (CRESTOR) 20 MG tablet Take 20 mg by mouth daily. (Patient not taking: No sig reported)   Not Taking at Unknown time   Social History   Socioeconomic History  . Marital status: Married    Spouse name: Not on file  . Number of children: Not on file  . Years of education: Not on file  . Highest education level: Not on file  Occupational History  . Not on file  Tobacco Use  . Smoking status: Never Smoker  .  Smokeless tobacco: Never Used  Substance and Sexual Activity  . Alcohol use: No  . Drug use: No  . Sexual activity: Not on file  Other Topics Concern  . Not on file  Social History Narrative  . Not on file   Social Determinants of Health   Financial Resource Strain: Not on file  Food Insecurity: Not on file  Transportation Needs: Not on file  Physical Activity: Not on file  Stress: Not on file  Social Connections: Not on file  Intimate Partner Violence: Not on file    Family History  Problem Relation Age of Onset  . Breast cancer Maternal Grandmother 70      Review of systems complete and found to be negative unless listed above      PHYSICAL EXAM  General: Well developed, well nourished, in no acute distress HEENT:  Normocephalic and atramatic Neck:  No JVD.  Lungs: Clear bilaterally to auscultation and percussion. Heart: HRRR . Normal S1 and S2 without gallops or murmurs.  Abdomen: Bowel sounds are positive, abdomen soft and non-tender  Msk:  Back normal, normal gait. Normal strength and tone for age. Extremities: No clubbing, cyanosis or edema.   Neuro: Alert and oriented X 3. Psych:  Good affect, responds appropriately  Labs:   Lab Results  Component Value Date   WBC 5.6 06/17/2020   HGB 13.4 06/17/2020   HCT 40.4 06/17/2020   MCV 86.0 06/17/2020   PLT 288 06/17/2020    Recent Labs  Lab 06/17/20 1209  NA 138  K 4.1  CL 102  CO2 23  BUN 24*  CREATININE 0.65  CALCIUM 9.9  GLUCOSE 98   Lab Results  Component Value Date   TROPONINI <0.03 10/04/2018    Lab Results  Component Value Date   CHOL 170 06/18/2020   Lab Results  Component Value Date   HDL 58 06/18/2020   Lab Results  Component Value Date   LDLCALC 103 (H) 06/18/2020   Lab Results  Component Value Date   TRIG 43 06/18/2020   Lab Results  Component Value Date   CHOLHDL 2.9 06/18/2020   No results found for: LDLDIRECT    Radiology: DG Chest 2 View  Result Date:  06/17/2020 CLINICAL DATA:  Chest pain radiating to LEFT arm a started within the last hour. EXAM: CHEST - 2 VIEW COMPARISON:  December 26, 2017 FINDINGS: Trachea midline. Cardiomediastinal contours and hilar structures are normal. Lungs are clear. On limited assessment no acute skeletal process. IMPRESSION: No active cardiopulmonary disease. Electronically Signed   By: Donzetta Kohut M.D.   On: 06/17/2020 12:39    EKG: Sinus rhythm, old anteroseptal MI, nonspecific T wave abnormality  ASSESSMENT AND PLAN:   1.  Non-ST elevation myocardial infarction, nonspecific ECG, elevated high-sensitivity troponin (27, 349, 704, 514), chest pain resolved on topical nitrates and heparin drip 2.  Essential hypertension, blood pressure in normal range 3.  Hyperlipidemia, on rosuvastatin  Recommendations  1.  Agree with overall current therapy 2.  Continue heparin drip 3.  Resume home BP medications 4.  Add low-dose metoprolol tartrate 12.5 mg p.o. twice daily 5.  Review 2D echocardiogram 6.  Cardiac catheterization and possible PCI scheduled for/11/2020.  The risk, benefits alternatives were explained to the patient and informed written consent was obtained.  Signed: Marcina Millard MD,PhD, [facility] 06/18/2020, 9:04 AM

## 2020-06-19 DIAGNOSIS — I214 Non-ST elevation (NSTEMI) myocardial infarction: Secondary | ICD-10-CM

## 2020-06-19 LAB — CBC
HCT: 37.2 % (ref 36.0–46.0)
Hemoglobin: 12.6 g/dL (ref 12.0–15.0)
MCH: 28.8 pg (ref 26.0–34.0)
MCHC: 33.9 g/dL (ref 30.0–36.0)
MCV: 85.1 fL (ref 80.0–100.0)
Platelets: 259 10*3/uL (ref 150–400)
RBC: 4.37 MIL/uL (ref 3.87–5.11)
RDW: 13.2 % (ref 11.5–15.5)
WBC: 5.2 10*3/uL (ref 4.0–10.5)
nRBC: 0 % (ref 0.0–0.2)

## 2020-06-19 LAB — HEPARIN LEVEL (UNFRACTIONATED)
Heparin Unfractionated: 0.13 IU/mL — ABNORMAL LOW (ref 0.30–0.70)
Heparin Unfractionated: 0.77 IU/mL — ABNORMAL HIGH (ref 0.30–0.70)
Heparin Unfractionated: 0.64 IU/mL (ref 0.30–0.70)

## 2020-06-19 MED ORDER — HEPARIN BOLUS VIA INFUSION
1750.0000 [IU] | Freq: Once | INTRAVENOUS | Status: AC
  Administered 2020-06-19: 1750 [IU] via INTRAVENOUS
  Filled 2020-06-19: qty 1750

## 2020-06-19 NOTE — Consult Note (Signed)
ANTICOAGULATION CONSULT NOTE   Pharmacy Consult for Heparin Indication: chest pain/ACS  No Known Allergies  Patient Measurements: Height: 5\' 4"  (162.6 cm) Weight: 58.3 kg (128 lb 8 oz) IBW/kg (Calculated) : 54.7 Heparin Dosing Weight: 59 kg  Vital Signs: Temp: 98 F (36.7 C) (02/06 0429) BP: 106/61 (02/06 0429) Pulse Rate: 48 (02/06 0429)  Labs: Recent Labs    06/17/20 1209 06/17/20 1415 06/17/20 1717 06/17/20 1824 06/17/20 2139 06/18/20 0018 06/18/20 0553 06/19/20 0440  HGB 13.4  --   --   --   --   --   --  12.6  HCT 40.4  --   --   --   --   --   --  37.2  PLT 288  --   --   --   --   --   --  259  APTT  --   --  35  --   --   --   --   --   LABPROT  --   --  13.3  --   --   --   --   --   INR  --   --  1.1  --   --   --   --   --   HEPARINUNFRC  --   --   --   --   --  0.35 0.44 0.13*  CREATININE 0.65  --   --   --   --   --   --   --   TROPONINIHS 27* 349*  --  704* 514*  --   --   --     Estimated Creatinine Clearance: 64.6 mL/min (by C-G formula based on SCr of 0.65 mg/dL).   Medical History: Past Medical History:  Diagnosis Date  . Depression   . Hypertension     Medications:  Medications Prior to Admission  Medication Sig Dispense Refill Last Dose  . melatonin 3 MG TABS tablet Take 6 mg by mouth at bedtime.   06/16/2020 at Unknown time  . telmisartan (MICARDIS) 40 MG tablet Take 40 mg by mouth daily.   06/17/2020 at 0630  . rosuvastatin (CRESTOR) 20 MG tablet Take 20 mg by mouth daily. (Patient not taking: No sig reported)   Not Taking at Unknown time   Scheduled:  Infusions:  PRN:  Anti-infectives (From admission, onward)   None      Assessment: Pharmacy consulted to start heparin for ACS. Trop 349. No note of DOAC PTA.   Goal of Therapy:  Heparin level 0.3-0.7 units/ml Monitor platelets by anticoagulation protocol: Yes   Plan:  2/5:  HL @ 0018 = 0.35 Will continue pt on current rate and draw confirmation level on 2/5 @ 0600.   2/5:   HL @ 0553 = 0.44 , therapeutic X 2  Will continue pt on current rate and recheck HL on 2/6 with AM labs.   2/6:  HL @ 0440 = 0.13. Spoke with RN who said that there had not been any interruptions in infusion.  Will order Heparin 1750 units IV X 1 bolus and increase drip rate to 900 units/hr.  Will recheck HL 6 hrs after rate change.   Macon Sandiford D, PharmD 06/19/2020,6:40 AM

## 2020-06-19 NOTE — Progress Notes (Addendum)
Progress Note    Savannah Myers  CVE:[id number] DOB: 1960-06-27  DOA: 06/17/2020 PCP: Gracelyn Nurse, MD      Brief Narrative:    Medical records reviewed and are as summarized below:  Savannah Myers is a 60 y.o. female with medical history significant for hypertension, hyperlipidemia, depression, presented to the hospital with chest pain.  Her troponins were elevated and she was admitted to the hospital for acute non-ST elevation MI.  She was treated with IV heparin infusion, topical nitrates, rosuvastatin, aspirin and metoprolol.  Cardiologist was consulted to assist with management.      Assessment/Plan:   Principal Problem:   NSTEMI (non-ST elevated myocardial infarction) (HCC) Active Problems:   Hyperlipidemia   Benign essential hypertension   Body mass index is 22.06 kg/m.    Acute NSTEMI: Continue IV heparin infusion.  Monitor heparin level per protocol.  Continue aspirin, Crestor, metoprolol.  Plan for left heart cath tomorrow.  Follow-up with cardiologist.  Hypertension: Continue antihypertensives  Hyperlipidemia: Continue rosuvastatin  Plan of care was discussed with her son, Jonetta Speak, over the phone.  All his questions were answered.  He was concerned that his mother was taking a lot of unapproved  supplements/herbal products.  Patient has been advised to avoid the supplements/herbal products.  Diet Order            Diet Heart Room service appropriate? Yes; Fluid consistency: Thin  Diet effective now                    Consultants:  Cardiologist  Procedures:  None    Medications:   . aspirin EC  325 mg Oral Daily  . irbesartan  75 mg Oral Daily  . metoprolol tartrate  12.5 mg Oral BID  . nitroGLYCERIN  0.5 inch Topical Q6H  . rosuvastatin  20 mg Oral Daily  . sodium chloride flush  3 mL Intravenous Q12H   Continuous Infusions: . heparin 900 Units/hr (06/19/20 0258)     Anti-infectives (From admission, onward)   None              Family Communication/Anticipated D/C date and plan/Code Status   DVT prophylaxis:      Code Status: Full Code  Family Communication: None Disposition Plan:    Status is: Observation  The patient will require care spanning > 2 midnights and should be moved to inpatient because: IV treatments appropriate due to intensity of illness or inability to take PO and Inpatient level of care appropriate due to severity of illness  Dispo: The patient is from: Home              Anticipated d/c is to: Home              Anticipated d/c date is: 3 days              Patient currently is not medically stable to d/c.   Difficult to place patient No           Subjective:    Interval events noted.  No chest pain or shortness of breath.   Objective:    Vitals:   06/18/20 2137 06/19/20 0429 06/19/20 0821 06/19/20 1114  BP: (!) 98/59 106/61 109/66 117/66  Pulse: (!) 54 (!) 48 (!) 52 (!) 52  Resp: 17 16 16 16   Temp: 98.1 F (36.7 C) 98 F (36.7 C) 98.3 F (36.8 C) 97.7 F (36.5 C)  TempSrc:  Oral Oral  SpO2: 100% 97% 99% 99%  Weight:      Height:       No data found.   Intake/Output Summary (Last 24 hours) at 06/19/2020 1259 Last data filed at 06/19/2020 1044 Gross per 24 hour  Intake 1360.75 ml  Output --  Net 1360.75 ml   Filed Weights   06/17/20 1157 06/18/20 0223  Weight: 59 kg 58.3 kg    Exam:  GEN: NAD SKIN: No rash EYES: EOMI ENT: MMM CV: RRR PULM: CTA B ABD: soft, ND, NT, +BS CNS: AAO x 3, non focal EXT: No edema or tenderness        Data Reviewed:   I have personally reviewed following labs and imaging studies:  Labs: Labs show the following:   Basic Metabolic Panel: Recent Labs  Lab 06/17/20 1209  NA 138  K 4.1  CL 102  CO2 23  GLUCOSE 98  BUN 24*  CREATININE 0.65  CALCIUM 9.9   GFR Estimated Creatinine Clearance: 64.6 mL/min (by C-G formula based on SCr of 0.65 mg/dL). Liver Function Tests: No results  for input(s): AST, ALT, ALKPHOS, BILITOT, PROT, ALBUMIN in the last 168 hours. No results for input(s): LIPASE, AMYLASE in the last 168 hours. No results for input(s): AMMONIA in the last 168 hours. Coagulation profile Recent Labs  Lab 06/17/20 1717  INR 1.1    CBC: Recent Labs  Lab 06/17/20 1209 06/19/20 0440  WBC 5.6 5.2  HGB 13.4 12.6  HCT 40.4 37.2  MCV 86.0 85.1  PLT 288 259   Cardiac Enzymes: No results for input(s): CKTOTAL, CKMB, CKMBINDEX, TROPONINI in the last 168 hours. BNP (last 3 results) No results for input(s): PROBNP in the last 8760 hours. CBG: No results for input(s): GLUCAP in the last 168 hours. D-Dimer: No results for input(s): DDIMER in the last 72 hours. Hgb A1c: No results for input(s): HGBA1C in the last 72 hours. Lipid Profile: Recent Labs    06/18/20 0018  CHOL 170  HDL 58  LDLCALC 103*  TRIG 43  CHOLHDL 2.9   Thyroid function studies: No results for input(s): TSH, T4TOTAL, T3FREE, THYROIDAB in the last 72 hours.  Invalid input(s): FREET3 Anemia work up: No results for input(s): VITAMINB12, FOLATE, FERRITIN, TIBC, IRON, RETICCTPCT in the last 72 hours. Sepsis Labs: Recent Labs  Lab 06/17/20 1209 06/19/20 0440  WBC 5.6 5.2    Microbiology Recent Results (from the past 240 hour(s))  SARS CORONAVIRUS 2 (TAT 6-24 HRS) Nasopharyngeal Nasopharyngeal Swab     Status: None   Collection Time: 06/17/20  4:02 PM   Specimen: Nasopharyngeal Swab  Result Value Ref Range Status   SARS Coronavirus 2 NEGATIVE NEGATIVE Final    Comment: (NOTE) SARS-CoV-2 target nucleic acids are NOT DETECTED.  The SARS-CoV-2 RNA is generally detectable in upper and lower respiratory specimens during the acute phase of infection. Negative results do not preclude SARS-CoV-2 infection, do not rule out co-infections with other pathogens, and should not be used as the sole basis for treatment or other patient management decisions. Negative results must be  combined with clinical observations, patient history, and epidemiological information. The expected result is Negative.  Fact Sheet for Patients: HairSlick.no  Fact Sheet for Healthcare Providers: quierodirigir.com  This test is not yet approved or cleared by the [place] FDA and  has been authorized for detection and/or diagnosis of SARS-CoV-2 by FDA under an Emergency Use Authorization (EUA). This EUA will remain  in effect (meaning  this test can be used) for the duration of the COVID-19 declaration under Se ction 564(b)(1) of the Act, 21 U.S.C. section 360bbb-3(b)(1), unless the authorization is terminated or revoked sooner.  Performed at [facility], [address], [place], [place] [address]     Procedures and diagnostic studies:  ECHOCARDIOGRAM COMPLETE  Result Date: 06/18/2020    ECHOCARDIOGRAM REPORT   Patient Name:   KALESHIA MUELLER Date of Exam: 06/18/2020 Medical Rec #:  [id number]           Height:       64.0 in Accession #:    [id number]          Weight:       128.5 lb Date of Birth:  1960-06-02            BSA:          1.621 m Patient Age:    59 years            BP:           117/64 mmHg Patient Gender: F                   HR:           60 bpm. Exam Location:  [facility] Procedure: 2D Echo Indications:     NSTEMI I21.4  History:         Patient has no prior history of Echocardiogram examinations.  Sonographer:     Wonda Cerise RDCS Referring Phys:  Gomez Cleverly LAMA Diagnosing Phys: Marcina Millard MD IMPRESSIONS  1. Left ventricular ejection fraction, by estimation, is 60 to 65%. The left ventricle has normal function. The left ventricle has no regional wall motion abnormalities. Left ventricular diastolic parameters are consistent with Grade I diastolic dysfunction (impaired relaxation).  2. Right ventricular systolic function is normal. The right ventricular size is normal.  3. The mitral valve is normal in  structure. Trivial mitral valve regurgitation. No evidence of mitral stenosis.  4. The aortic valve is normal in structure. Aortic valve regurgitation is not visualized. No aortic stenosis is present.  5. The inferior vena cava is normal in size with greater than 50% respiratory variability, suggesting right atrial pressure of 3 mmHg. FINDINGS  Left Ventricle: Left ventricular ejection fraction, by estimation, is 60 to 65%. The left ventricle has normal function. The left ventricle has no regional wall motion abnormalities. The left ventricular internal cavity size was normal in size. There is  no left ventricular hypertrophy. Left ventricular diastolic parameters are consistent with Grade I diastolic dysfunction (impaired relaxation). Right Ventricle: The right ventricular size is normal. No increase in right ventricular wall thickness. Right ventricular systolic function is normal. Left Atrium: Left atrial size was normal in size. Right Atrium: Right atrial size was normal in size. Pericardium: There is no evidence of pericardial effusion. Mitral Valve: The mitral valve is normal in structure. Trivial mitral valve regurgitation. No evidence of mitral valve stenosis. Tricuspid Valve: The tricuspid valve is normal in structure. Tricuspid valve regurgitation is trivial. No evidence of tricuspid stenosis. Aortic Valve: The aortic valve is normal in structure. Aortic valve regurgitation is not visualized. No aortic stenosis is present. Aortic valve peak gradient measures 9.1 mmHg. Pulmonic Valve: The pulmonic valve was normal in structure. Pulmonic valve regurgitation is not visualized. No evidence of pulmonic stenosis. Aorta: The aortic root is normal in size and structure. Venous: The inferior vena cava is normal in size  with greater than 50% respiratory variability, suggesting right atrial pressure of 3 mmHg. IAS/Shunts: No atrial level shunt detected by color flow Doppler.  LEFT VENTRICLE PLAX 2D LVIDd:          4.73 cm  Diastology LVIDs:         3.03 cm  LV e' medial:   5.98 cm/s LV PW:         0.89 cm  LV E/e' medial: 8.4 LV IVS:        0.93 cm LVOT diam:     1.80 cm LV SV:         51 LV SV Index:   31 LVOT Area:     2.54 cm  RIGHT VENTRICLE RV Basal diam:  2.45 cm RV S prime:     16.30 cm/s TAPSE (M-mode): 2.5 cm LEFT ATRIUM             Index       RIGHT ATRIUM           Index LA diam:        3.60 cm 2.22 cm/m  RA Area:     12.10 cm LA Vol (A2C):   28.8 ml 17.77 ml/m RA Volume:   26.00 ml  16.04 ml/m LA Vol (A4C):   25.5 ml 15.73 ml/m LA Biplane Vol: 27.3 ml 16.84 ml/m  AORTIC VALVE                PULMONIC VALVE AV Area (Vmax): 1.74 cm    PV Vmax:       0.86 m/s AV Vmax:        151.00 cm/s PV Peak grad:  2.9 mmHg AV Peak Grad:   9.1 mmHg LVOT Vmax:      103.00 cm/s LVOT Vmean:     59.300 cm/s LVOT VTI:       0.200 m  AORTA Ao Root diam: 2.60 cm Ao Asc diam:  2.60 cm MITRAL VALVE MV Area (PHT): 2.52 cm    SHUNTS MV Decel Time: 301 msec    Systemic VTI:  0.20 m MV E velocity: 50.10 cm/s  Systemic Diam: 1.80 cm MV A velocity: 78.00 cm/s MV E/A ratio:  0.64 Marcina Millard MD Electronically signed by Marcina Millard MD Signature Date/Time: 06/18/2020/2:37:06 PM    Final                LOS: 1 day   Kong Packett  [facility]   Pager on [email or web address]. If 7PM-7AM, please contact night-coverage at [email or web address]     06/19/2020, 12:59 PM

## 2020-06-19 NOTE — Progress Notes (Signed)
[facility] Cardiology  SUBJECTIVE: Patient laying in bed, denies chest pain or shortness of breath   Vitals:   06/18/20 1708 06/18/20 2137 06/19/20 0429 06/19/20 0821  BP: 96/76 (!) 98/59 106/61 109/66  Pulse: (!) 55 (!) 54 (!) 48 (!) 52  Resp: 18 17 16 16   Temp: 98.4 F (36.9 C) 98.1 F (36.7 C) 98 F (36.7 C) 98.3 F (36.8 C)  TempSrc:    Oral  SpO2: 100% 100% 97% 99%  Weight:      Height:         Intake/Output Summary (Last 24 hours) at 06/19/2020 08/17/2020 Last data filed at 06/18/2020 1507 Gross per 24 hour  Intake 1120.75 ml  Output -  Net 1120.75 ml      PHYSICAL EXAM  General: Well developed, well nourished, in no acute distress HEENT:  Normocephalic and atramatic Neck:  No JVD.  Lungs: Clear bilaterally to auscultation and percussion. Heart: HRRR . Normal S1 and S2 without gallops or murmurs.  Abdomen: Bowel sounds are positive, abdomen soft and non-tender  Msk:  Back normal, normal gait. Normal strength and tone for age. Extremities: No clubbing, cyanosis or edema.   Neuro: Alert and oriented X 3. Psych:  Good affect, responds appropriately   LABS: Basic Metabolic Panel: Recent Labs    06/17/20 1209  NA 138  K 4.1  CL 102  CO2 23  GLUCOSE 98  BUN 24*  CREATININE 0.65  CALCIUM 9.9   Liver Function Tests: No results for input(s): AST, ALT, ALKPHOS, BILITOT, PROT, ALBUMIN in the last 72 hours. No results for input(s): LIPASE, AMYLASE in the last 72 hours. CBC: Recent Labs    06/17/20 1209 06/19/20 0440  WBC 5.6 5.2  HGB 13.4 12.6  HCT 40.4 37.2  MCV 86.0 85.1  PLT 288 259   Cardiac Enzymes: No results for input(s): CKTOTAL, CKMB, CKMBINDEX, TROPONINI in the last 72 hours. BNP: Invalid input(s): POCBNP D-Dimer: No results for input(s): DDIMER in the last 72 hours. Hemoglobin A1C: No results for input(s): HGBA1C in the last 72 hours. Fasting Lipid Panel: Recent Labs    06/18/20 0018  CHOL 170  HDL 58  LDLCALC 103*  TRIG 43  CHOLHDL 2.9    Thyroid Function Tests: No results for input(s): TSH, T4TOTAL, T3FREE, THYROIDAB in the last 72 hours.  Invalid input(s): FREET3 Anemia Panel: No results for input(s): VITAMINB12, FOLATE, FERRITIN, TIBC, IRON, RETICCTPCT in the last 72 hours.  DG Chest 2 View  Result Date: 06/17/2020 CLINICAL DATA:  Chest pain radiating to LEFT arm a started within the last hour. EXAM: CHEST - 2 VIEW COMPARISON:  December 26, 2017 FINDINGS: Trachea midline. Cardiomediastinal contours and hilar structures are normal. Lungs are clear. On limited assessment no acute skeletal process. IMPRESSION: No active cardiopulmonary disease. Electronically Signed   By: December 28, 2017 M.D.   On: 06/17/2020 12:39   ECHOCARDIOGRAM COMPLETE  Result Date: 06/18/2020    ECHOCARDIOGRAM REPORT   Patient Name:   Savannah Myers Date of Exam: 06/18/2020 Medical Rec #:  08/16/2020           Height:       64.0 in Accession #:    [id number]          Weight:       128.5 lb Date of Birth:  Jun 23, 1960            BSA:          1.621 m Patient Age:  60 years            BP:           117/64 mmHg Patient Gender: F                   HR:           60 bpm. Exam Location:  [facility] Procedure: 2D Echo Indications:     NSTEMI I21.4  History:         Patient has no prior history of Echocardiogram examinations.  Sonographer:     Wonda Cerise RDCS Referring Phys:  Gomez Cleverly LAMA Diagnosing Phys: Marcina Millard MD IMPRESSIONS  1. Left ventricular ejection fraction, by estimation, is 60 to 65%. The left ventricle has normal function. The left ventricle has no regional wall motion abnormalities. Left ventricular diastolic parameters are consistent with Grade I diastolic dysfunction (impaired relaxation).  2. Right ventricular systolic function is normal. The right ventricular size is normal.  3. The mitral valve is normal in structure. Trivial mitral valve regurgitation. No evidence of mitral stenosis.  4. The aortic valve is normal in structure. Aortic  valve regurgitation is not visualized. No aortic stenosis is present.  5. The inferior vena cava is normal in size with greater than 50% respiratory variability, suggesting right atrial pressure of 3 mmHg. FINDINGS  Left Ventricle: Left ventricular ejection fraction, by estimation, is 60 to 65%. The left ventricle has normal function. The left ventricle has no regional wall motion abnormalities. The left ventricular internal cavity size was normal in size. There is  no left ventricular hypertrophy. Left ventricular diastolic parameters are consistent with Grade I diastolic dysfunction (impaired relaxation). Right Ventricle: The right ventricular size is normal. No increase in right ventricular wall thickness. Right ventricular systolic function is normal. Left Atrium: Left atrial size was normal in size. Right Atrium: Right atrial size was normal in size. Pericardium: There is no evidence of pericardial effusion. Mitral Valve: The mitral valve is normal in structure. Trivial mitral valve regurgitation. No evidence of mitral valve stenosis. Tricuspid Valve: The tricuspid valve is normal in structure. Tricuspid valve regurgitation is trivial. No evidence of tricuspid stenosis. Aortic Valve: The aortic valve is normal in structure. Aortic valve regurgitation is not visualized. No aortic stenosis is present. Aortic valve peak gradient measures 9.1 mmHg. Pulmonic Valve: The pulmonic valve was normal in structure. Pulmonic valve regurgitation is not visualized. No evidence of pulmonic stenosis. Aorta: The aortic root is normal in size and structure. Venous: The inferior vena cava is normal in size with greater than 50% respiratory variability, suggesting right atrial pressure of 3 mmHg. IAS/Shunts: No atrial level shunt detected by color flow Doppler.  LEFT VENTRICLE PLAX 2D LVIDd:         4.73 cm  Diastology LVIDs:         3.03 cm  LV e' medial:   5.98 cm/s LV PW:         0.89 cm  LV E/e' medial: 8.4 LV IVS:        0.93  cm LVOT diam:     1.80 cm LV SV:         51 LV SV Index:   31 LVOT Area:     2.54 cm  RIGHT VENTRICLE RV Basal diam:  2.45 cm RV S prime:     16.30 cm/s TAPSE (M-mode): 2.5 cm LEFT ATRIUM             Index  RIGHT ATRIUM           Index LA diam:        3.60 cm 2.22 cm/m  RA Area:     12.10 cm LA Vol (A2C):   28.8 ml 17.77 ml/m RA Volume:   26.00 ml  16.04 ml/m LA Vol (A4C):   25.5 ml 15.73 ml/m LA Biplane Vol: 27.3 ml 16.84 ml/m  AORTIC VALVE                PULMONIC VALVE AV Area (Vmax): 1.74 cm    PV Vmax:       0.86 m/s AV Vmax:        151.00 cm/s PV Peak grad:  2.9 mmHg AV Peak Grad:   9.1 mmHg LVOT Vmax:      103.00 cm/s LVOT Vmean:     59.300 cm/s LVOT VTI:       0.200 m  AORTA Ao Root diam: 2.60 cm Ao Asc diam:  2.60 cm MITRAL VALVE MV Area (PHT): 2.52 cm    SHUNTS MV Decel Time: 301 msec    Systemic VTI:  0.20 m MV E velocity: 50.10 cm/s  Systemic Diam: 1.80 cm MV A velocity: 78.00 cm/s MV E/A ratio:  0.64 Marcina Millard MD Electronically signed by Marcina Millard MD Signature Date/Time: 06/18/2020/2:37:06 PM    Final      Echo LVEF 60 to 65%  TELEMETRY: Sinus rhythm:  ASSESSMENT AND PLAN:  Principal Problem:   ACS (acute coronary syndrome) (HCC) Active Problems:   Hyperlipidemia   Benign essential hypertension   NSTEMI (non-ST elevated myocardial infarction) (HCC)    1.   Non-ST elevation myocardial infarction, nonspecific ECG, elevated high-sensitivity troponin (27, 349, 704, 514), chest pain resolved on topical nitrates and heparin drip 2.  Essential hypertension, blood pressure in normal range 3.  Hyperlipidemia, on rosuvastatin  Recommendations  1.  Agree with overall current therapy 2.  Continue heparin drip 3.   Continue irbesartan 75 mg daily 4.  Metoprolol  tartrate being held for bradycardia 5.  Cardiac catheterization with selective coronary arteriography scheduled for the a.m.  The risk, benefits and alternatives of cardiac catheterization and  possible PCI were explained to the patient and informed written consent was obtained.   Marcina Millard, MD, PhD, [facility] 06/19/2020 9:03 AM

## 2020-06-19 NOTE — Consult Note (Signed)
ANTICOAGULATION CONSULT NOTE   Pharmacy Consult for Heparin Indication: chest pain/ACS  No Known Allergies  Patient Measurements: Height: 5\' 4"  (162.6 cm) Weight: 58.3 kg (128 lb 8 oz) IBW/kg (Calculated) : 54.7 Heparin Dosing Weight: 59 kg  Vital Signs: Temp: 97.7 F (36.5 C) (02/06 1114) Temp Source: Oral (02/06 1114) BP: 117/66 (02/06 1114) Pulse Rate: 52 (02/06 1114)  Labs: Recent Labs    06/17/20 1209 06/17/20 1415 06/17/20 1717 06/17/20 1824 06/17/20 2139 06/18/20 0018 06/18/20 0553 06/19/20 0440 06/19/20 1334  HGB 13.4  --   --   --   --   --   --  12.6  --   HCT 40.4  --   --   --   --   --   --  37.2  --   PLT 288  --   --   --   --   --   --  259  --   APTT  --   --  35  --   --   --   --   --   --   LABPROT  --   --  13.3  --   --   --   --   --   --   INR  --   --  1.1  --   --   --   --   --   --   HEPARINUNFRC  --   --   --   --   --    < > 0.44 0.13* 0.77*  CREATININE 0.65  --   --   --   --   --   --   --   --   TROPONINIHS 27* 349*  --  704* 514*  --   --   --   --    < > = values in this interval not displayed.    Estimated Creatinine Clearance: 64.6 mL/min (by C-G formula based on SCr of 0.65 mg/dL).   Medical History: Past Medical History:  Diagnosis Date  . Depression   . Hypertension     Medications:  Medications Prior to Admission  Medication Sig Dispense Refill Last Dose  . melatonin 3 MG TABS tablet Take 6 mg by mouth at bedtime.   06/16/2020 at Unknown time  . telmisartan (MICARDIS) 40 MG tablet Take 40 mg by mouth daily.   06/17/2020 at 0630  . rosuvastatin (CRESTOR) 20 MG tablet Take 20 mg by mouth daily. (Patient not taking: No sig reported)   Not Taking at Unknown time   Scheduled:  Infusions:  PRN:  Anti-infectives (From admission, onward)   None      Assessment: Pharmacy consulted to start heparin for ACS. Trop 349. No note of DOAC PTA.   2/5:  HL @ 0018 = 0.35  2/5:  HL @ 0553 = 0.44 , therapeutic X 2  2/6:  HL @  0440 = 0.13 Spoke with RN who said that there had not been any interruptions in infusion. Heparin 1750 units IV X 1 bolus and increase drip rate to 900 units/hr.  2/6:  HL @1334  = 0.77, Will decrease heparin to 800 units/hr.   Goal of Therapy:  Heparin level 0.3-0.7 units/ml Monitor platelets by anticoagulation protocol: Yes   Plan:  Heparin is supratherapeutic. Will decrease the heparin infusion to 800 units/hr. Recheck heparin level in 6 hours. CBC daily while on heparin.   08/15/2020, PharmD, BCPS 06/19/2020,2:26 PM

## 2020-06-19 NOTE — Consult Note (Signed)
ANTICOAGULATION CONSULT NOTE   Pharmacy Consult for Heparin Indication: chest pain/ACS  No Known Allergies  Patient Measurements: Height: 5\' 4"  (162.6 cm) Weight: 58.3 kg (128 lb 8 oz) IBW/kg (Calculated) : 54.7 Heparin Dosing Weight: 59 kg  Vital Signs: Temp: 98.2 F (36.8 C) (02/06 1934) Temp Source: Oral (02/06 1934) BP: 127/68 (02/06 1934) Pulse Rate: 52 (02/06 1934)  Labs: Recent Labs    06/17/20 1209 06/17/20 1415 06/17/20 1717 06/17/20 1824 06/17/20 2139 06/18/20 0018 06/19/20 0440 06/19/20 1334 06/19/20 2047  HGB 13.4  --   --   --   --   --  12.6  --   --   HCT 40.4  --   --   --   --   --  37.2  --   --   PLT 288  --   --   --   --   --  259  --   --   APTT  --   --  35  --   --   --   --   --   --   LABPROT  --   --  13.3  --   --   --   --   --   --   INR  --   --  1.1  --   --   --   --   --   --   HEPARINUNFRC  --   --   --   --   --    < > 0.13* 0.77* 0.64  CREATININE 0.65  --   --   --   --   --   --   --   --   TROPONINIHS 27* 349*  --  704* 514*  --   --   --   --    < > = values in this interval not displayed.    Estimated Creatinine Clearance: 64.6 mL/min (by C-G formula based on SCr of 0.65 mg/dL).   Medical History: Past Medical History:  Diagnosis Date  . Depression   . Hypertension     Medications:  Medications Prior to Admission  Medication Sig Dispense Refill Last Dose  . melatonin 3 MG TABS tablet Take 6 mg by mouth at bedtime.   06/16/2020 at Unknown time  . telmisartan (MICARDIS) 40 MG tablet Take 40 mg by mouth daily.   06/17/2020 at 0630  . rosuvastatin (CRESTOR) 20 MG tablet Take 20 mg by mouth daily. (Patient not taking: No sig reported)   Not Taking at Unknown time   Scheduled:  Infusions:  PRN:  Anti-infectives (From admission, onward)   None      Assessment: Pharmacy consulted to start heparin for ACS. Trop 349. No note of DOAC PTA.   2/5:  HL @ 0018 = 0.35  2/5:  HL @ 0553 = 0.44 , therapeutic X 2  2/6:  HL @  0440 = 0.13 Spoke with RN who said that there had not been any interruptions in infusion. Heparin 1750 units IV X 1 bolus and increase drip rate to 900 units/hr.  2/6:  HL @1334  = 0.77, Will decrease heparin to 800 units/hr.  2/6   HL @ 2047= 0.64, therapeutic. Recheck HL in 6 hours  This evening, heparin level is within therapeutic range at 0.64 after infusion rate decrease this afternoon. Hgb and platelets last WNL. No issues with the infusion or overt bleeding noted. Planning for [facility] tomorrow, 2/7  Goal of Therapy:  Heparin level 0.3-0.7 units/ml Monitor platelets by anticoagulation protocol: Yes  Plan:  Continue heparin infusion at 800 units/hr Check heparin in 6 hours  Monitor CBC, daily heparin level  Continue to monitor for signs/symptoms of bleeding   Harlow Mares, PharmD Clinical Pharmacist  06/19/2020   9:09 PM

## 2020-06-20 ENCOUNTER — Encounter
Admission: EM | Disposition: A | Discharge status: Home / Self Care | Payer: Self-pay | Source: Home / Self Care | Attending: Internal Medicine

## 2020-06-20 ENCOUNTER — Other Ambulatory Visit: Payer: Self-pay

## 2020-06-20 ENCOUNTER — Encounter: Payer: Self-pay | Admitting: Internal Medicine

## 2020-06-20 DIAGNOSIS — I5189 Other ill-defined heart diseases: Secondary | ICD-10-CM

## 2020-06-20 DIAGNOSIS — I248 Other forms of acute ischemic heart disease: Principal | ICD-10-CM

## 2020-06-20 HISTORY — PX: LEFT HEART CATH AND CORONARY ANGIOGRAPHY: CATH118249

## 2020-06-20 LAB — BASIC METABOLIC PANEL
Anion gap: 8 (ref 5–15)
BUN: 20 mg/dL (ref 6–20)
CO2: 22 mmol/L (ref 22–32)
Calcium: 9.5 mg/dL (ref 8.9–10.3)
Chloride: 110 mmol/L (ref 98–111)
Creatinine, Ser: 0.74 mg/dL (ref 0.44–1.00)
GFR, Estimated: >60 mL/min (ref >60)
Glucose, Bld: 89 mg/dL (ref 70–99)
Potassium: 3.7 mmol/L (ref 3.5–5.1)
Sodium: 140 mmol/L (ref 135–145)

## 2020-06-20 LAB — CBC
HCT: 38.9 % (ref 36.0–46.0)
Hemoglobin: 12.7 g/dL (ref 12.0–15.0)
MCH: 28.2 pg (ref 26.0–34.0)
MCHC: 32.6 g/dL (ref 30.0–36.0)
MCV: 86.3 fL (ref 80.0–100.0)
Platelets: 253 10*3/uL (ref 150–400)
RBC: 4.51 MIL/uL (ref 3.87–5.11)
RDW: 13.2 % (ref 11.5–15.5)
WBC: 5.7 10*3/uL (ref 4.0–10.5)
nRBC: 0 % (ref 0.0–0.2)

## 2020-06-20 LAB — HEPARIN LEVEL (UNFRACTIONATED): Heparin Unfractionated: 0.59 IU/mL (ref 0.30–0.70)

## 2020-06-20 SURGERY — LEFT HEART CATH AND CORONARY ANGIOGRAPHY
Anesthesia: Moderate Sedation

## 2020-06-20 MED ORDER — HEPARIN SODIUM (PORCINE) 1000 UNIT/ML IJ SOLN
INTRAMUSCULAR | Status: DC | PRN
  Administered 2020-06-20: 3000 [IU] via INTRAVENOUS

## 2020-06-20 MED ORDER — MIDAZOLAM HCL 2 MG/2ML IJ SOLN
INTRAMUSCULAR | Status: AC
  Filled 2020-06-20: qty 2

## 2020-06-20 MED ORDER — FENTANYL CITRATE (PF) 100 MCG/2ML IJ SOLN
INTRAMUSCULAR | Status: AC
  Filled 2020-06-20: qty 2

## 2020-06-20 MED ORDER — SODIUM CHLORIDE 0.9 % WEIGHT BASED INFUSION
3.0000 mL/kg/h | INTRAVENOUS | Status: AC
  Administered 2020-06-20: 3 mL/kg/h via INTRAVENOUS

## 2020-06-20 MED ORDER — HEPARIN (PORCINE) IN NACL 1000-0.9 UT/500ML-% IV SOLN
INTRAVENOUS | Status: AC
  Filled 2020-06-20: qty 1000

## 2020-06-20 MED ORDER — IOHEXOL 300 MG/ML  SOLN
INTRAMUSCULAR | Status: DC | PRN
  Administered 2020-06-20: 85 mL

## 2020-06-20 MED ORDER — MIDAZOLAM HCL 2 MG/2ML IJ SOLN
INTRAMUSCULAR | Status: DC | PRN
  Administered 2020-06-20: 0.5 mg via INTRAVENOUS

## 2020-06-20 MED ORDER — HYDRALAZINE HCL 20 MG/ML IJ SOLN: 10.0000 mg | INTRAMUSCULAR | Status: AC | PRN

## 2020-06-20 MED ORDER — SODIUM CHLORIDE 0.9% FLUSH: 3.0000 mL | INTRAVENOUS | Status: DC | PRN

## 2020-06-20 MED ORDER — LIDOCAINE HCL (PF) 1 % IJ SOLN
INTRAMUSCULAR | Status: AC
  Filled 2020-06-20: qty 30

## 2020-06-20 MED ORDER — LIDOCAINE HCL (PF) 1 % IJ SOLN
INTRAMUSCULAR | Status: DC | PRN
  Administered 2020-06-20: 2 mL

## 2020-06-20 MED ORDER — SODIUM CHLORIDE 0.9% FLUSH: 3.0000 mL | Freq: Two times a day (BID) | INTRAVENOUS | Status: DC

## 2020-06-20 MED ORDER — ASPIRIN 81 MG PO TBEC: 81.0000 mg | DELAYED_RELEASE_TABLET | Freq: Every day | ORAL | Status: DC

## 2020-06-20 MED ORDER — SODIUM CHLORIDE 0.9 % IV SOLN: 250.0000 mL | INTRAVENOUS | Status: DC | PRN

## 2020-06-20 MED ORDER — SODIUM CHLORIDE 0.9 % WEIGHT BASED INFUSION: 1.0000 mL/kg/h | INTRAVENOUS | Status: DC

## 2020-06-20 MED ORDER — FENTANYL CITRATE (PF) 100 MCG/2ML IJ SOLN
INTRAMUSCULAR | Status: DC | PRN
  Administered 2020-06-20: 25 ug via INTRAVENOUS

## 2020-06-20 MED ORDER — ASPIRIN 81 MG PO CHEW: 81.0000 mg | CHEWABLE_TABLET | ORAL | Status: AC

## 2020-06-20 MED ORDER — HEPARIN SODIUM (PORCINE) 1000 UNIT/ML IJ SOLN
INTRAMUSCULAR | Status: AC
  Filled 2020-06-20: qty 1

## 2020-06-20 MED ORDER — ONDANSETRON HCL 4 MG/2ML IJ SOLN: 4.0000 mg | Freq: Four times a day (QID) | INTRAMUSCULAR | Status: DC | PRN

## 2020-06-20 MED ORDER — VERAPAMIL HCL 2.5 MG/ML IV SOLN
INTRAVENOUS | Status: DC | PRN
  Administered 2020-06-20: 2.5 mg via INTRAVENOUS

## 2020-06-20 MED ORDER — HEPARIN (PORCINE) IN NACL 1000-0.9 UT/500ML-% IV SOLN
INTRAVENOUS | Status: DC | PRN
  Administered 2020-06-20: 500 mL

## 2020-06-20 MED ORDER — ACETAMINOPHEN 325 MG PO TABS: 650.0000 mg | ORAL_TABLET | ORAL | Status: DC | PRN

## 2020-06-20 MED ORDER — ASPIRIN 81 MG PO CHEW
CHEWABLE_TABLET | ORAL | Status: AC
  Administered 2020-06-20: 81 mg via ORAL
  Filled 2020-06-20: qty 1

## 2020-06-20 MED ORDER — LABETALOL HCL 5 MG/ML IV SOLN: 10.0000 mg | INTRAVENOUS | Status: AC | PRN

## 2020-06-20 MED ORDER — VERAPAMIL HCL 2.5 MG/ML IV SOLN
INTRAVENOUS | Status: AC
  Filled 2020-06-20: qty 2

## 2020-06-20 SURGICAL SUPPLY — 8 items
CATH 5F 110X4 TIG (CATHETERS) ×2 IMPLANT
DEVICE RAD TR BAND REGULAR (VASCULAR PRODUCTS) ×2 IMPLANT
GLIDESHEATH SLEND SS 6F .021 (SHEATH) ×2 IMPLANT
GUIDEWIRE INQWIRE 1.5J.035X260 (WIRE) ×1 IMPLANT
INQWIRE 1.5J .035X260CM (WIRE) ×2
KIT MANI 3VAL PERCEP (MISCELLANEOUS) ×2 IMPLANT
PACK CARDIAC CATH (CUSTOM PROCEDURE TRAY) ×2 IMPLANT
WIRE HITORQ VERSACORE ST 145CM (WIRE) ×2 IMPLANT

## 2020-06-20 NOTE — Consult Note (Signed)
ANTICOAGULATION CONSULT NOTE   Pharmacy Consult for Heparin Indication: chest pain/ACS  No Known Allergies  Patient Measurements: Height: 5\' 4"  (162.6 cm) Weight: 56.8 kg (125 lb 3.2 oz) IBW/kg (Calculated) : 54.7 Heparin Dosing Weight: 59 kg  Vital Signs: Temp: 98.3 F (36.8 C) (02/07 0405) Temp Source: Oral (02/06 1934) BP: 104/56 (02/07 0405) Pulse Rate: 42 (02/07 0405)  Labs: Recent Labs    06/17/20 1209 06/17/20 1415 06/17/20 1717 06/17/20 1824 06/17/20 2139 06/18/20 0018 06/19/20 0440 06/19/20 1334 06/19/20 2047 06/20/20 0404  HGB 13.4  --   --   --   --   --  12.6  --   --  12.7  HCT 40.4  --   --   --   --   --  37.2  --   --  38.9  PLT 288  --   --   --   --   --  259  --   --  253  APTT  --   --  35  --   --   --   --   --   --   --   LABPROT  --   --  13.3  --   --   --   --   --   --   --   INR  --   --  1.1  --   --   --   --   --   --   --   HEPARINUNFRC  --   --   --   --   --    < > 0.13* 0.77* 0.64 0.59  CREATININE 0.65  --   --   --   --   --   --   --   --  0.74  TROPONINIHS 27* 349*  --  704* 514*  --   --   --   --   --    < > = values in this interval not displayed.    Estimated Creatinine Clearance: 64.6 mL/min (by C-G formula based on SCr of 0.74 mg/dL).   Medical History: Past Medical History:  Diagnosis Date  . Depression   . Hypertension     Medications:  Medications Prior to Admission  Medication Sig Dispense Refill Last Dose  . melatonin 3 MG TABS tablet Take 6 mg by mouth at bedtime.   06/16/2020 at Unknown time  . telmisartan (MICARDIS) 40 MG tablet Take 40 mg by mouth daily.   06/17/2020 at 0630  . rosuvastatin (CRESTOR) 20 MG tablet Take 20 mg by mouth daily. (Patient not taking: No sig reported)   Not Taking at Unknown time   Scheduled:  Infusions:  PRN:  Anti-infectives (From admission, onward)   None      Assessment: Pharmacy consulted to start heparin for ACS. Trop 349. No note of DOAC PTA.   2/5:  HL @ 0018 =  0.35  2/5:  HL @ 0553 = 0.44 , therapeutic X 2  2/6:  HL @ 0440 = 0.13 Spoke with RN who said that there had not been any interruptions in infusion. Heparin 1750 units IV X 1 bolus and increase drip rate to 900 units/hr.  2/6:  HL @1334  = 0.77, Will decrease heparin to 800 units/hr.  2/6   HL @ 2047= 0.64, therapeutic. Recheck HL in 6 hours 2/7   HL @ 0404 = 0.59, therapeutic X 2   This evening, heparin level is  within therapeutic range at 0.64 after infusion rate decrease this afternoon. Hgb and platelets last WNL. No issues with the infusion or overt bleeding noted. Planning for [facility] tomorrow, 2/7  Goal of Therapy:  Heparin level 0.3-0.7 units/ml Monitor platelets by anticoagulation protocol: Yes  Plan:  2/7:   HL @ 0404 = 0.59  Will continue pt on current rate and recheck HL on 2/8 with AM labs.   Zilla Shartzer D Clinical Pharmacist  06/20/2020   4:58 AM

## 2020-06-20 NOTE — Discharge Summary (Addendum)
Physician Discharge Summary  Savannah Myers YQM:[id number] DOB: 1960/07/01 DOA: 06/17/2020  PCP: Gracelyn Nurse, MD  Admit date: 06/17/2020 Discharge date: 06/20/2020  Discharge disposition: Home   Recommendations for Outpatient Follow-Up:   Follow-up with Dr. Darrold Junker, cardiologist, in 1 week   Discharge Diagnosis:   Principal Problem:   Demand ischemia [facility]) Active Problems:   Hyperlipidemia   Benign essential hypertension   Diastolic dysfunction    Discharge Condition: Stable.  Diet recommendation:  Diet Order            Diet Heart Room service appropriate? Yes; Fluid consistency: Thin  Diet effective now           Diet - low sodium heart healthy                   Code Status: Full Code     Hospital Course:   [name] is a 60 y.o. female with medical history significant for hypertension, hyperlipidemia, depression, presented to the hospital with chest pain.  Her troponins were elevated and she was admitted to the hospital for suspected acute non-ST elevation MI.  She was treated with IV heparin infusion, topical nitrates, rosuvastatin, aspirin and metoprolol.  Cardiologist was consulted to assist with management.  She underwent left heart cath which showed normal coronaries and normal LV function.  NSTEMI was ruled out and her clinical features were attributed to demand ischemia.  2D echo showed grade 1 diastolic dysfunction.  Her symptoms have resolved.  Case discussed with Dr. Darrold Junker, cardiologist, via secure chat, patient is okay for discharge.  Discharge plan and echo findings were discussed with patient and her daughter-in-law at the bedside.  All their questions were answered.       Medical Consultants:    Cardiologist   Discharge Exam:    Vitals:   06/20/20 1015 06/20/20 1030 06/20/20 1135 06/20/20 1207  BP:  107/62 113/60 133/69  Pulse:  (!) 49 (!) 50 (!) 49  Resp: 15 13 12 15   Temp:   98.5 F (36.9 C) (!) 97.5  F (36.4 C)  TempSrc:   Oral Oral  SpO2:    99%  Weight:      Height:         GEN: NAD SKIN: No rash EYES: EOMI ENT: MMM CV: RRR PULM: CTA B ABD: soft, ND, NT, +BS CNS: AAO x 3, non focal EXT: No edema or tenderness   The results of significant diagnostics from this hospitalization (including imaging, microbiology, ancillary and laboratory) are listed below for reference.     Procedures and Diagnostic Studies:   ECHOCARDIOGRAM COMPLETE  Result Date: 06/18/2020    ECHOCARDIOGRAM REPORT   Patient Name:   Savannah Myers Date of Exam: 06/18/2020 Medical Rec #:  08/16/2020           Height:       64.0 in Accession #:    [id number]          Weight:       128.5 lb Date of Birth:  11-20-1960            BSA:          1.621 m Patient Age:    59 years            BP:           117/64 mmHg Patient Gender: F  HR:           60 bpm. Exam Location:  [facility] Procedure: 2D Echo Indications:     NSTEMI I21.4  History:         Patient has no prior history of Echocardiogram examinations.  Sonographer:     Wonda Cerise RDCS Referring Phys:  Gomez Cleverly LAMA Diagnosing Phys: Marcina Millard MD IMPRESSIONS  1. Left ventricular ejection fraction, by estimation, is 60 to 65%. The left ventricle has normal function. The left ventricle has no regional wall motion abnormalities. Left ventricular diastolic parameters are consistent with Grade I diastolic dysfunction (impaired relaxation).  2. Right ventricular systolic function is normal. The right ventricular size is normal.  3. The mitral valve is normal in structure. Trivial mitral valve regurgitation. No evidence of mitral stenosis.  4. The aortic valve is normal in structure. Aortic valve regurgitation is not visualized. No aortic stenosis is present.  5. The inferior vena cava is normal in size with greater than 50% respiratory variability, suggesting right atrial pressure of 3 mmHg. FINDINGS  Left Ventricle: Left ventricular ejection  fraction, by estimation, is 60 to 65%. The left ventricle has normal function. The left ventricle has no regional wall motion abnormalities. The left ventricular internal cavity size was normal in size. There is  no left ventricular hypertrophy. Left ventricular diastolic parameters are consistent with Grade I diastolic dysfunction (impaired relaxation). Right Ventricle: The right ventricular size is normal. No increase in right ventricular wall thickness. Right ventricular systolic function is normal. Left Atrium: Left atrial size was normal in size. Right Atrium: Right atrial size was normal in size. Pericardium: There is no evidence of pericardial effusion. Mitral Valve: The mitral valve is normal in structure. Trivial mitral valve regurgitation. No evidence of mitral valve stenosis. Tricuspid Valve: The tricuspid valve is normal in structure. Tricuspid valve regurgitation is trivial. No evidence of tricuspid stenosis. Aortic Valve: The aortic valve is normal in structure. Aortic valve regurgitation is not visualized. No aortic stenosis is present. Aortic valve peak gradient measures 9.1 mmHg. Pulmonic Valve: The pulmonic valve was normal in structure. Pulmonic valve regurgitation is not visualized. No evidence of pulmonic stenosis. Aorta: The aortic root is normal in size and structure. Venous: The inferior vena cava is normal in size with greater than 50% respiratory variability, suggesting right atrial pressure of 3 mmHg. IAS/Shunts: No atrial level shunt detected by color flow Doppler.  LEFT VENTRICLE PLAX 2D LVIDd:         4.73 cm  Diastology LVIDs:         3.03 cm  LV e' medial:   5.98 cm/s LV PW:         0.89 cm  LV E/e' medial: 8.4 LV IVS:        0.93 cm LVOT diam:     1.80 cm LV SV:         51 LV SV Index:   31 LVOT Area:     2.54 cm  RIGHT VENTRICLE RV Basal diam:  2.45 cm RV S prime:     16.30 cm/s TAPSE (M-mode): 2.5 cm LEFT ATRIUM             Index       RIGHT ATRIUM           Index LA diam:         3.60 cm 2.22 cm/m  RA Area:     12.10 cm LA Vol (A2C):   28.8 ml  17.77 ml/m RA Volume:   26.00 ml  16.04 ml/m LA Vol (A4C):   25.5 ml 15.73 ml/m LA Biplane Vol: 27.3 ml 16.84 ml/m  AORTIC VALVE                PULMONIC VALVE AV Area (Vmax): 1.74 cm    PV Vmax:       0.86 m/s AV Vmax:        151.00 cm/s PV Peak grad:  2.9 mmHg AV Peak Grad:   9.1 mmHg LVOT Vmax:      103.00 cm/s LVOT Vmean:     59.300 cm/s LVOT VTI:       0.200 m  AORTA Ao Root diam: 2.60 cm Ao Asc diam:  2.60 cm MITRAL VALVE MV Area (PHT): 2.52 cm    SHUNTS MV Decel Time: 301 msec    Systemic VTI:  0.20 m MV E velocity: 50.10 cm/s  Systemic Diam: 1.80 cm MV A velocity: 78.00 cm/s MV E/A ratio:  0.64 Marcina Millard MD Electronically signed by Marcina Millard MD Signature Date/Time: 06/18/2020/2:37:06 PM    Final      Labs:   Basic Metabolic Panel: Recent Labs  Lab 06/17/20 1209 06/20/20 0404  NA 138 140  K 4.1 3.7  CL 102 110  CO2 23 22  GLUCOSE 98 89  BUN 24* 20  CREATININE 0.65 0.74  CALCIUM 9.9 9.5   GFR Estimated Creatinine Clearance: 64.6 mL/min (by C-G formula based on SCr of 0.74 mg/dL). Liver Function Tests: No results for input(s): AST, ALT, ALKPHOS, BILITOT, PROT, ALBUMIN in the last 168 hours. No results for input(s): LIPASE, AMYLASE in the last 168 hours. No results for input(s): AMMONIA in the last 168 hours. Coagulation profile Recent Labs  Lab 06/17/20 1717  INR 1.1    CBC: Recent Labs  Lab 06/17/20 1209 06/19/20 0440 06/20/20 0404  WBC 5.6 5.2 5.7  HGB 13.4 12.6 12.7  HCT 40.4 37.2 38.9  MCV 86.0 85.1 86.3  PLT 288 259 253   Cardiac Enzymes: No results for input(s): CKTOTAL, CKMB, CKMBINDEX, TROPONINI in the last 168 hours. BNP: Invalid input(s): POCBNP CBG: No results for input(s): GLUCAP in the last 168 hours. D-Dimer No results for input(s): DDIMER in the last 72 hours. Hgb A1c No results for input(s): HGBA1C in the last 72 hours. Lipid Profile Recent Labs     06/18/20 0018  CHOL 170  HDL 58  LDLCALC 103*  TRIG 43  CHOLHDL 2.9   Thyroid function studies No results for input(s): TSH, T4TOTAL, T3FREE, THYROIDAB in the last 72 hours.  Invalid input(s): FREET3 Anemia work up No results for input(s): VITAMINB12, FOLATE, FERRITIN, TIBC, IRON, RETICCTPCT in the last 72 hours. Microbiology Recent Results (from the past 240 hour(s))  SARS CORONAVIRUS 2 (TAT 6-24 HRS) Nasopharyngeal Nasopharyngeal Swab     Status: None   Collection Time: 06/17/20  4:02 PM   Specimen: Nasopharyngeal Swab  Result Value Ref Range Status   SARS Coronavirus 2 NEGATIVE NEGATIVE Final    Comment: (NOTE) SARS-CoV-2 target nucleic acids are NOT DETECTED.  The SARS-CoV-2 RNA is generally detectable in upper and lower respiratory specimens during the acute phase of infection. Negative results do not preclude SARS-CoV-2 infection, do not rule out co-infections with other pathogens, and should not be used as the sole basis for treatment or other patient management decisions. Negative results must be combined with clinical observations, patient history, and epidemiological information. The expected result is Negative.  Fact  Sheet for Patients: HairSlick.no  Fact Sheet for Healthcare Providers: quierodirigir.com  This test is not yet approved or cleared by the [place] FDA and  has been authorized for detection and/or diagnosis of SARS-CoV-2 by FDA under an Emergency Use Authorization (EUA). This EUA will remain  in effect (meaning this test can be used) for the duration of the COVID-19 declaration under Se ction 564(b)(1) of the Act, 21 U.S.C. section 360bbb-3(b)(1), unless the authorization is terminated or revoked sooner.  Performed at [facility], [address] [place], [place] [address]      Discharge Instructions:   Discharge Instructions    Diet - low sodium heart healthy   Complete  by: As directed    Increase activity slowly   Complete by: As directed      Allergies as of 06/20/2020   No Known Allergies     Medication List    TAKE these medications   aspirin 81 MG EC tablet Take 1 tablet (81 mg total) by mouth daily.   melatonin 3 MG Tabs tablet Take 6 mg by mouth at bedtime.   rosuvastatin 20 MG tablet Commonly known as: CRESTOR Take 20 mg by mouth daily.   telmisartan 40 MG tablet Commonly known as: MICARDIS Take 40 mg by mouth daily.       Follow-up Information    Marcina Millard, MD On 06/27/2020.   Specialty: Cardiology Why: follow up in a week.... @ 2pm Contact information: [address] [facility] [place] [address] [phone]                Time coordinating discharge: 32 minutes  Signed:  Lurene Shadow  [facility] 06/20/2020, 3:28 PM   Pager on [email or web address]. If 7PM-7AM, please contact night-coverage at [email or web address]

## 2020-06-20 NOTE — Progress Notes (Signed)
Report given to [facility] on floor, Dr. Myriam Forehand at bedside to update pt. Pt has dc instruction for heart failure added to dc instructions. Pt denies pain or needs at this time. Will transport pt to unit via bed on tele.

## 2020-06-20 NOTE — Consult Note (Addendum)
° °  Heart Failure Nurse Navigator Note  HFpEF 60 to 65%.  Grade 1 diastolic dysfunction.  Normal right ventricular systolic function.    She presented to the emergency room with complaints of pressure-like feeling in her chest without radiation.  She denied any nausea vomiting or diarrhea, or shortness of breath.  Troponin was initially 27 and peaked at 704.   Comorbidities:  Depression Hypertension   Medications:  Crestor 20 mg daily Telmisartan 40 mg daily Aspirin 81 mg daily   She is unable to be placed on beta-blocker at this time due to bradycardic rates ranging from 49 to 55 bpm.  Labs:  Sodium 140, potassium 3.7, chloride 110, CO2 22, BUN 20, creatinine 0.74, WBC 5.7, hemoglobin 12.7, hematocrit 38.9, platelets 253. Weight is 56.8 kg BMI 21.49 Blood pressure 133/69 Intake and output not documented.   Assessment:  Savannah Myers is awake and alert eating eating lunch.  In no acute distress.  HEENT-pupils are equal, good dentition, no JVD.  Cardiac-heart tones of regular rate and rhythm without murmurs or gallops.  Chest-breath sounds are clear to posterior auscultation.  Abdomen-soft non tender.  Musculoskeletal-there is no lower extremity edema noted.  Neurologic-speech is clear, moves all extremities without difficulty.  Psych-is pleasant and appropriate makes good eye contact.    Initial meeting with patient after she returned from cardiac catheterization.  She states that she understands [place] and wishes to continue in [place].  She would also like to include her son-in-law Savannah Myers in  our conversation by speaker phone.  Luis asked what kind of congestive heart failure that she had, as he had been reading on the Internet.  Explained that there are two kinds of  heart failure, one with reduced ejection fraction and then heart failure with preserved ejection fraction, which was what his mother-in-law has.  Explained  that she has normal function of the  left ventricle (the bottom of her heart) that that ventricle is stiff and does not relax to accept and pump blood as it should.  Explained that is going to be important that she keeps her blood pressures in check, recommending checking daily, after she rests for 10 to 15 minutes and then to record blood pressure along with her heart rate.  Also recommend daily weights and recording.  Discussed low-sodium diet, foods to avoid.  They voiced understanding.  Instructed that she has appointment in the outpatient heart failure clinic on February 18 at 1:30 PM.  And to take to that appointment the records of her weights, blood pressure and heart rate along with her medications.  They had no further questions.  Tresa Endo RN, CHFN

## 2020-06-22 ENCOUNTER — Telehealth: Payer: Self-pay

## 2020-07-01 ENCOUNTER — Ambulatory Visit: Payer: BLUE CROSS/BLUE SHIELD | Attending: Family | Admitting: Family

## 2020-07-01 ENCOUNTER — Encounter: Payer: Self-pay | Admitting: Family

## 2020-07-01 ENCOUNTER — Other Ambulatory Visit: Payer: Self-pay

## 2020-07-01 VITALS — BP 120/66 | HR 65 | Resp 20 | Ht 64.0 in | Wt 133.1 lb

## 2020-07-01 DIAGNOSIS — Z7982 Long term (current) use of aspirin: Secondary | ICD-10-CM | POA: Diagnosis not present

## 2020-07-01 DIAGNOSIS — G478 Other sleep disorders: Secondary | ICD-10-CM | POA: Insufficient documentation

## 2020-07-01 DIAGNOSIS — I110 Hypertensive heart disease with heart failure: Secondary | ICD-10-CM | POA: Diagnosis not present

## 2020-07-01 DIAGNOSIS — G479 Sleep disorder, unspecified: Secondary | ICD-10-CM

## 2020-07-01 DIAGNOSIS — I509 Heart failure, unspecified: Secondary | ICD-10-CM | POA: Diagnosis present

## 2020-07-01 DIAGNOSIS — I10 Essential (primary) hypertension: Secondary | ICD-10-CM

## 2020-07-01 DIAGNOSIS — I5032 Chronic diastolic (congestive) heart failure: Secondary | ICD-10-CM

## 2020-07-01 NOTE — Progress Notes (Signed)
Patient ID: Savannah Myers, female    DOB: 1961-01-11, 60 y.o.   MRN: [id number]  HPI  Ms Delton See is a 60 y/o female with a history of  HTN, depression and chronic heart failure.   Echo report from 06/18/20 reviewed and showed an EF of 60-65%  LHC done 06/20/20 showed: 1.  Normal left dominant coronary anatomy 2.  Normal left ventricular function  Admitted 06/17/20 due to chest pain. Troponins elevated. Cardiology consult obtained. LHC done which showed normal coronaries. Discharged after 3 days.   She presents today for her initial visit with a chief complaint of minimal fatigue upon moderate exertion. She describes this as having been present for several months. She has associated dry cough, head congestion, back pan, anxiety and difficulty sleeping along with this. She denies any dizziness, abdominal distention, palpitations, pedal edema, chest pain, shortness of breath or weight gain.   Says that he's having trouble falling asleep but also staying asleep. Does admit that she's currently under some stress and that that may be playing into her difficulty sleeping.   Past Medical History:  Diagnosis Date  . CHF (congestive heart failure) (HCC)   . Depression   . Hypertension    Past Surgical History:  Procedure Laterality Date  . ABDOMINAL HYSTERECTOMY    . LEFT HEART CATH AND CORONARY ANGIOGRAPHY N/A 06/20/2020   Procedure: LEFT HEART CATH AND CORONARY ANGIOGRAPHY;  Surgeon: Marcina Millard, MD;  Location: [facility] INVASIVE CV LAB;  Service: Cardiovascular;  Laterality: N/A;   Family History  Problem Relation Age of Onset  . Breast cancer Maternal Grandmother 47   Social History   Tobacco Use  . Smoking status: Never Smoker  . Smokeless tobacco: Never Used  Substance Use Topics  . Alcohol use: No   No Known Allergies Prior to Admission medications   Medication Sig Start Date End Date Taking? Authorizing Provider  aspirin EC 81 MG EC tablet Take 1 tablet (81 mg total) by  mouth daily. 06/20/20  Yes Lurene Shadow, MD  melatonin 3 MG TABS tablet Take 6 mg by mouth at bedtime.   Yes [provider]  rosuvastatin (CRESTOR) 20 MG tablet Take 20 mg by mouth daily. 10/20/18  Yes [provider]  telmisartan (MICARDIS) 40 MG tablet Take 40 mg by mouth daily. 10/21/18  Yes [provider]     Review of Systems  Constitutional: Positive for fatigue. Negative for appetite change.  HENT: Positive for congestion. Negative for postnasal drip and sore throat.   Eyes: Negative.   Respiratory: Positive for cough (dry). Negative for chest tightness and shortness of breath.   Cardiovascular: Negative for chest pain, palpitations and leg swelling.  Gastrointestinal: Negative for abdominal distention and abdominal pain.  Endocrine: Negative.   Genitourinary: Negative.   Musculoskeletal: Positive for back pain. Negative for neck pain.  Skin: Negative.   Allergic/Immunologic: Negative.   Neurological: Negative for dizziness and light-headedness.  Hematological: Negative for adenopathy. Does not bruise/bleed easily.  Psychiatric/Behavioral: Positive for sleep disturbance (trouble falling/ staying asleep). Negative for dysphoric mood. The patient is nervous/anxious.     Vitals:   07/01/20 1340  BP: 120/66  Pulse: 65  Resp: 20  SpO2: 100%  Weight: 133 lb 2 oz (60.4 kg)  Height: 5\' 4"  (1.626 m)   Wt Readings from Last 3 Encounters:  07/01/20 133 lb 2 oz (60.4 kg)  06/20/20 125 lb 3.2 oz (56.8 kg)  10/04/18 151 lb 10.8 oz (68.8 kg)  Lab Results  Component Value Date   CREATININE 0.74 06/20/2020   CREATININE 0.65 06/17/2020   CREATININE 0.69 10/05/2018    Physical Exam Vitals and nursing note reviewed.  Constitutional:      Appearance: Normal appearance.  HENT:     Head: Normocephalic and atraumatic.  Cardiovascular:     Rate and Rhythm: Normal rate and regular rhythm.  Pulmonary:     Effort: Pulmonary effort is normal. No respiratory  distress.     Breath sounds: No wheezing or rales.  Abdominal:     General: There is no distension.     Palpations: Abdomen is soft.  Musculoskeletal:        General: No tenderness.     Cervical back: Normal range of motion and neck supple.     Right lower leg: No edema.     Left lower leg: No edema.  Skin:    General: Skin is warm and dry.  Neurological:     General: No focal deficit present.     Mental Status: She is alert and oriented to person, place, and time.  Psychiatric:        Mood and Affect: Mood normal.        Behavior: Behavior normal.        Thought Content: Thought content normal.     Assessment & Plan:  1: Chronic heart failure with preserved ejection fraction without structural changes- - NYHA class II - euvolemic today - weighing daily; reminded to call for an overnight weight gain of > 2 pounds or a weekly weight gain of > 5 pounds - not adding salt and she was encouraged to follow a low sodium diet - saw cardiology Gwen Pounds) 06/27/20 & statin changed to atorvastatin but patient hasn't picked it up yet - has received her flu vaccine - has received 2 covid vaccines  2: HTN- - BP looks good today - saw PCP Juanetta Gosling) 04/12/20 - BMP 06/20/20 reviewed and showed sodium 140, potassium 3.7, creatinine 0.74 and GFR >60  3: Sleeping difficulty- - reports trouble falling asleep as well as staying asleep - encouraged her to f/u with her PCP about this   Patient did not bring her medications nor a list. Each medication was verbally reviewed with the patient and she was encouraged to bring the bottles to every visit to confirm accuracy of list.  Due to HF stability, will not make a return appointment for patient at this time. Advised patient that she could call back at anytime to schedule another appointment. Encouraged close follow-up with PCP and cardiologist. Patient was comfortable with this plan.

## 2020-07-01 NOTE — Patient Instructions (Addendum)
Continue weighing daily and call for an overnight weight gain of > 2 pounds or a weekly weight gain of >5 pounds.   Call us in the future if you'd like to schedule another appointment 

## 2021-03-29 ENCOUNTER — Other Ambulatory Visit: Payer: Self-pay | Admitting: Internal Medicine

## 2021-03-29 DIAGNOSIS — Z1231 Encounter for screening mammogram for malignant neoplasm of breast: Secondary | ICD-10-CM

## 2022-06-02 ENCOUNTER — Ambulatory Visit
Admission: EM | Admit: 2022-06-02 | Discharge: 2022-06-02 | Disposition: A | Discharge status: Home / Self Care | Payer: Managed Care, Other (non HMO) | Attending: Urgent Care | Admitting: Urgent Care

## 2022-06-02 DIAGNOSIS — J069 Acute upper respiratory infection, unspecified: Secondary | ICD-10-CM | POA: Diagnosis not present

## 2022-06-02 MED ORDER — BENZONATATE 100 MG PO CAPS: ORAL_CAPSULE | ORAL | 0 refills | Status: DC

## 2022-06-02 NOTE — ED Provider Notes (Signed)
[facility]    CSN: [id number] Arrival date & time: 06/02/22  1209      History   Chief Complaint Chief Complaint  Patient presents with   Cough   Nasal Congestion   Generalized Body Aches   Chills    HPI Savannah Myers is a 62 y.o. female.    Cough   Patient presents to urgent care with complaint of cough, body aches, chills, nasal congestion x 5 days.  Most symptoms resolved. She is using over-the-counter medication for her symptoms as well as some homeopathic remedies such as tea.  She reports that the nighttime medication that she uses is helping her sleep but reports cough is bothering her during the day.  Past Medical History:  Diagnosis Date   CHF (congestive heart failure) ([place])    Depression    Hypertension     Patient Active Problem List   Diagnosis Date Noted   Diastolic dysfunction 40/81/4481   Demand ischemia 06/18/2020   Enteritis 10/04/2018   Body mass index (BMI) of 32.0-32.9 in adult 10/29/2017   Varicose veins of both lower extremities with pain 03/06/2017   Hyperlipidemia 03/06/2017   Bilateral lower extremity edema 03/06/2017   Pure hypercholesterolemia 02/11/2017   Onychomycosis of toenail 10/20/2016   Lumbar strain, initial encounter 10/19/2016   Postmenopausal 10/19/2016   Prediabetes 10/19/2016   Chronic insomnia 07/20/2016   Gastroesophageal reflux disease without esophagitis 07/20/2016   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       Home Medications    Prior to Admission medications   Medication Sig Start Date End Date Taking? Authorizing Provider  aspirin EC 81 MG EC tablet Take 1 tablet (81 mg total) by mouth daily. 06/20/20   Jennye Boroughs, MD  melatonin 3 MG TABS tablet Take 6 mg by mouth at bedtime.    [provider]  rosuvastatin (CRESTOR) 20 MG tablet Take 20 mg by mouth daily. 10/20/18   [provider]  telmisartan (MICARDIS) 40 MG tablet Take 40 mg by mouth daily. 10/21/18   [provider]    Family History Family History  Problem Relation Age of Onset   Breast cancer Maternal Grandmother 70    Social History Social History   Tobacco Use   Smoking status: Never   Smokeless tobacco: Never  Substance Use Topics   Alcohol use: No   Drug use: No     Allergies   Patient has no known allergies.   Review of Systems Review of Systems  Respiratory:  Positive for cough.      Physical Exam Triage Vital Signs ED Triage Vitals  Enc Vitals Group     BP      Pulse      Resp      Temp      Temp src      SpO2      Weight      Height      Head Circumference  Peak Flow      Pain Score      Pain Loc      Pain Edu?      Excl. in [place]?    No data found.  Updated Vital Signs LMP  (LMP Unknown)   Visual Acuity Right Eye Distance:   Left Eye Distance:   Bilateral Distance:    Right Eye Near:   Left Eye Near:    Bilateral Near:     Physical Exam Vitals reviewed.  Constitutional:      Appearance: Normal appearance. She is ill-appearing.  HENT:     Mouth/Throat:     Mouth: Mucous membranes are moist.     Pharynx: No oropharyngeal exudate or posterior oropharyngeal erythema.  Cardiovascular:     Rate and Rhythm: Normal rate and regular rhythm.     Pulses: Normal pulses.     Heart sounds: Normal heart sounds.  Pulmonary:     Effort: Pulmonary effort is normal.     Breath sounds: Normal breath sounds.  Skin:    General: Skin is warm and dry.   Neurological:     General: No focal deficit present.     Mental Status: She is alert and oriented to person, place, and time.  Psychiatric:        Mood and Affect: Mood normal.        Behavior: Behavior normal.      UC Treatments / Results  Labs (all labs ordered are listed, but only abnormal results are displayed) Labs Reviewed - No data to display  EKG   Radiology No results found.  Procedures Procedures (including critical care time)  Medications Ordered in UC Medications - No data to display  Initial Impression / Assessment and Plan / UC Course  I have reviewed the triage vital signs and the nursing notes.  Pertinent labs & imaging results that were available during my care of the patient were reviewed by me and considered in my medical decision making (see chart for details).   Patient is afebrile here without recent antipyretics. Satting well on room air. Overall is ill appearing, well hydrated, without respiratory distress. Pulmonary exam is unremarkable.  Lungs CTAB without wheezing, rhonchi, rales.  Patient's symptoms are consistent with an acute viral process including influenza or COVID.  She is outside the treatment window for both of these viruses and no antivirals were offered.  Recommended continued use of OTC medication for symptom control and will prescribe benzonatate for daytime cough.  Final Clinical Impressions(s) / UC Diagnoses   Final diagnoses:  None   Discharge Instructions   None    ED Prescriptions   None    PDMP not reviewed this encounter.   Rose Phi, [place] 06/02/22 1234

## 2022-06-02 NOTE — ED Triage Notes (Signed)
Pt. Presents to UC w/ c/o a cough, body aches, chills and nasal congestion for the past 5 days.

## 2022-06-02 NOTE — Discharge Instructions (Signed)
Follow up here or with your primary care provider if your symptoms are worsening or not improving.     

## 2022-06-18 ENCOUNTER — Other Ambulatory Visit: Payer: Self-pay | Admitting: Cardiovascular Disease

## 2022-06-18 DIAGNOSIS — E782 Mixed hyperlipidemia: Secondary | ICD-10-CM

## 2022-07-02 ENCOUNTER — Ambulatory Visit
Admission: EM | Admit: 2022-07-02 | Discharge: 2022-07-02 | Disposition: A | Discharge status: Home / Self Care | Payer: Managed Care, Other (non HMO) | Attending: Emergency Medicine | Admitting: Emergency Medicine

## 2022-07-02 DIAGNOSIS — J302 Other seasonal allergic rhinitis: Secondary | ICD-10-CM | POA: Diagnosis not present

## 2022-07-02 DIAGNOSIS — L239 Allergic contact dermatitis, unspecified cause: Secondary | ICD-10-CM | POA: Diagnosis not present

## 2022-07-02 MED ORDER — PREDNISONE 10 MG (21) PO TBPK: ORAL_TABLET | Freq: Every day | ORAL | 0 refills | Status: DC

## 2022-07-02 MED ORDER — CETIRIZINE HCL 10 MG PO TABS: 10.0000 mg | ORAL_TABLET | Freq: Every day | ORAL | 0 refills | Status: DC

## 2022-07-02 NOTE — ED Triage Notes (Signed)
Patient to Urgent Care with complaints of itchy rash present to entire body. Reports she is waking up during the night itching. Concerned about allergic reaction.  Symptoms started last week. Has not been using any new products/ foods. Uses sensitive skin products.

## 2022-07-02 NOTE — ED Provider Notes (Signed)
[facility]    CSN: [id number] Arrival date & time: 07/02/22  1433      History   Chief Complaint Chief Complaint  Patient presents with   Allergic Reaction    HPI Savannah Myers is a 62 y.o. female.  Patient presents with pruritic rash on her trunk and extremities x 1 week.  She denies new products, foods, medications.  No treatment attempted at home.  Patient also reports sneezing, congestion, cough x 3-4 weeks.  She denies fever, chills, chest pain, shortness of breath, or other symptoms.   Patient was seen at this urgent care on 06/02/2022; diagnosed with viral URI with cough; treated with Tessalon Perles.   The history is provided by the patient and medical records.    Past Medical History:  Diagnosis Date   CHF (congestive heart failure) ([place])    Depression    Hypertension     Patient Active Problem List   Diagnosis Date Noted   Diastolic dysfunction [id number]   Demand ischemia 06/18/2020   Enteritis 10/04/2018   Body mass index (BMI) of 32.0-32.9 in adult 10/29/2017   Varicose veins of both lower extremities with pain 03/06/2017   Hyperlipidemia 03/06/2017   Bilateral lower extremity edema 03/06/2017   Pure hypercholesterolemia 02/11/2017   Onychomycosis of toenail 10/20/2016   Lumbar strain, initial encounter 10/19/2016   Postmenopausal 10/19/2016   Prediabetes 10/19/2016   Chronic insomnia 07/20/2016   Gastroesophageal reflux disease without esophagitis 07/20/2016   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      Home Medications    Prior to Admission medications   Medication Sig Start Date End Date Taking? Authorizing Provider  cetirizine (ZYRTEC ALLERGY) 10 MG tablet Take 1 tablet (10 mg total) by mouth daily for 14 days. 07/02/22 07/16/22 Yes Sharion Balloon, NP  predniSONE (STERAPRED UNI-PAK 21 TAB) 10 MG (21) TBPK tablet Take by mouth daily. As directed 07/02/22  Yes Sharion Balloon, NP  aspirin EC 81 MG EC tablet Take 1 tablet (81 mg total) by mouth daily. Patient not taking: Reported on 07/02/2022 06/20/20   Jennye Boroughs, MD  benzonatate (TESSALON) 100 MG capsule Take 1-2 tablets 3 times a day as needed for cough Patient not taking: Reported on 07/02/2022 06/02/22   Immordino, Annie Main, FNP  melatonin 3 MG TABS tablet Take 6 mg by mouth at bedtime.    [provider]  rosuvastatin (CRESTOR) 20 MG tablet Take 20 mg by mouth daily. Patient not taking: Reported on 07/02/2022 10/20/18   [provider]  telmisartan (MICARDIS) 20 MG tablet TAKE 1 TABLET BY MOUTH EVERY DAY **MAKE FOLLOW UP APPT WITH DR. [facility] FOR REFILLS** 06/18/22   Dionisio David, MD    Family History Family History  Problem Relation Age of Onset   Breast cancer Maternal Grandmother 70    Social History Social History   Tobacco Use   Smoking status: Never   Smokeless tobacco: Never  Substance Use Topics   Alcohol use:  No   Drug use: No     Allergies   Patient has no known allergies.   Review of Systems Review of Systems  Constitutional:  Negative for chills and fever.  HENT:  Positive for congestion, postnasal drip, rhinorrhea and sneezing. Negative for ear pain and sore throat.   Respiratory:  Positive for cough. Negative for shortness of breath.   Cardiovascular:  Negative for chest pain and palpitations.  Gastrointestinal:  Negative for abdominal pain, diarrhea and vomiting.  Musculoskeletal:  Negative for arthralgias and back  pain.  Skin:  Positive for rash. Negative for color change.  All other systems reviewed and are negative.    Physical Exam Triage Vital Signs ED Triage Vitals  Enc Vitals Group     BP 07/02/22 1613 138/83     Pulse Rate 07/02/22 1607 63     Resp 07/02/22 1607 18     Temp 07/02/22 1607 98.3 F (36.8 C)     Temp src --      SpO2 07/02/22 1607 98 %     Weight --      Height --      Head Circumference --      Peak Flow --      Pain Score 07/02/22 1609 0     Pain Loc --      Pain Edu? --      Excl. in [place]? --    No data found.  Updated Vital Signs BP 138/83   Pulse 63   Temp 98.3 F (36.8 C)   Resp 18   LMP  (LMP Unknown)   SpO2 98%   Visual Acuity Right Eye Distance:   Left Eye Distance:   Bilateral Distance:    Right Eye Near:   Left Eye Near:    Bilateral Near:     Physical Exam Vitals and nursing note reviewed.  Constitutional:      General: She is not in acute distress.    Appearance: Normal appearance. She is well-developed. She is not ill-appearing.  HENT:     Right Ear: Tympanic membrane normal.     Left Ear: Tympanic membrane normal.     Nose: Nose normal.     Mouth/Throat:     Mouth: Mucous membranes are moist.     Pharynx: Oropharynx is clear.     Comments: Clear PND. Cardiovascular:     Rate and Rhythm: Normal rate and regular rhythm.     Heart sounds: Normal heart sounds.  Pulmonary:     Effort: Pulmonary effort is normal. No respiratory distress.     Breath sounds: Normal breath sounds. No wheezing, rhonchi or rales.  Musculoskeletal:     Cervical back: Neck supple.  Skin:    General: Skin is warm and dry.     Capillary Refill: Capillary refill takes less than 2 seconds.     Findings: No rash.     Comments: No rash noted.   Neurological:     Mental Status: She is alert.  Psychiatric:        Mood and Affect: Mood normal.        Behavior: Behavior normal.      UC Treatments / Results  Labs (all labs ordered are listed, but only  abnormal results are displayed) Labs Reviewed - No data to display  EKG   Radiology No results found.  Procedures Procedures (including critical care time)  Medications Ordered in UC Medications - No data to display  Initial Impression /  Assessment and Plan / UC Course  I have reviewed the triage vital signs and the nursing notes.  Pertinent labs & imaging results that were available during my care of the patient were reviewed by me and considered in my medical decision making (see chart for details).   Allergic dermatitis, seasonal allergies.  Treating with Zyrtec and 6 day taper of prednisone.  Education provided on allergic rhinitis and contact dermatitis.  Instructed patient to follow up with her PCP if her symptoms are not improving.  She agrees to plan of care.     Final Clinical Impressions(s) / UC Diagnoses   Final diagnoses:  Allergic dermatitis  Seasonal allergies     Discharge Instructions      Take the Zyrtec and prednisone as directed.  Follow up with your primary care provider if your symptoms are not improving.        ED Prescriptions     Medication Sig Dispense Auth. Provider   cetirizine (ZYRTEC ALLERGY) 10 MG tablet Take 1 tablet (10 mg total) by mouth daily for 14 days. 14 tablet Sharion Balloon, NP   predniSONE (STERAPRED UNI-PAK 21 TAB) 10 MG (21) TBPK tablet Take by mouth daily. As directed 21 tablet Sharion Balloon, NP      PDMP not reviewed this encounter.   Sharion Balloon, NP 07/02/22 [phone]

## 2022-07-02 NOTE — Discharge Instructions (Addendum)
Take the Zyrtec and prednisone as directed.  Follow up with your primary care provider if your symptoms are not improving.

## 2022-07-16 ENCOUNTER — Encounter: Payer: Self-pay | Admitting: Internal Medicine

## 2022-07-16 ENCOUNTER — Ambulatory Visit (INDEPENDENT_AMBULATORY_CARE_PROVIDER_SITE_OTHER): Payer: Managed Care, Other (non HMO) | Admitting: Internal Medicine

## 2022-07-16 VITALS — BP 120/64 | HR 75 | Ht 65.0 in | Wt 160.0 lb

## 2022-07-16 DIAGNOSIS — E782 Mixed hyperlipidemia: Secondary | ICD-10-CM

## 2022-07-16 DIAGNOSIS — I10 Essential (primary) hypertension: Secondary | ICD-10-CM

## 2022-07-16 DIAGNOSIS — J301 Allergic rhinitis due to pollen: Secondary | ICD-10-CM | POA: Insufficient documentation

## 2022-07-16 DIAGNOSIS — F411 Generalized anxiety disorder: Secondary | ICD-10-CM

## 2022-07-16 NOTE — Progress Notes (Signed)
Established Patient Office Visit  Subjective:  Patient ID: Savannah Myers, female    DOB: 06/21/1960  Age: 62 y.o. MRN: [id number]  Chief Complaint  Patient presents with   Follow-up    Follow up    Patient comes in for follow-up today.  Patient has been in the emergency room twice over the last 2 months.  She had complaints of congestion sneezing coughing nasal discharge and then chest congestion.  During these visits her COVID and flu tests have been negative.  She also had a pruritic skin breakout on second visit.  She was diagnosed with allergies and prescribed prednisone Dosepak as well as Zyrtec. Today patient reports that  she continues to have chest congestion and some cough especially at night.  She does have a mild runny nose and postnasal drip.  She is still taking Zyrtec but had completed her Medrol Dosepak last month.  She does not have any fever or chills, no bodyaches, and no yellow-green nasal discharge. She is coughing but she is not producing any yellow-green mucus. Patient has history of high blood pressure and high cholesterol in the past.  She is no longer taking her rosuvastatin however she is still taking her blood pressure medicine. She has been treated in the past for anxiety disorder with Duloxetine and Wellbutrin but she stopped taking them.  Today she says she is trying to manage her anxiety on her. Patient is fasting we will get blood work as well as an Haematologist. Will try Breztri inhaler- sample given.     Past Medical History:  Diagnosis Date   CHF (congestive heart failure) ([facility])    Depression    Hypertension    Past Surgical History:  Procedure Laterality Date   ABDOMINAL HYSTERECTOMY     LEFT HEART CATH AND CORONARY ANGIOGRAPHY N/A 06/20/2020   Procedure: LEFT HEART CATH AND CORONARY ANGIOGRAPHY;  Surgeon: Isaias Cowman, MD;  Location: [facility];  Service: Cardiovascular;  Laterality: N/A;    Social History    Socioeconomic History   Marital status: Married    Spouse name: Not on file   Number of children: Not on file   Years of education: Not on file   Highest education level: Not on file  Occupational History   Not on file  Tobacco Use   Smoking status: Never   Smokeless tobacco: Never  Substance and Sexual Activity   Alcohol use: No   Drug use: No   Sexual activity: Not on file  Other Topics Concern   Not on file  Social History Narrative   Not on file   Social Determinants of Health   Financial Resource Strain: Not on file  Food Insecurity: Not on file  Transportation Needs: No Transportation Needs (07/01/2020)   PRAPARE - Hydrologist (Medical): No    Lack of Transportation (Non-Medical): No  Physical Activity: Not on file  Stress: Not on file  Social Connections: Not on file  Intimate Partner Violence: Not on file    Family History  Problem Relation Age of Onset   Breast cancer Maternal Grandmother 1    No Known Allergies  Review of Systems  Constitutional:  Positive for malaise/fatigue. Negative for chills, diaphoresis, fever and weight loss.  HENT:  Positive for congestion and sinus pain. Negative for ear discharge, ear pain, nosebleeds and sore throat.   Eyes: Negative.   Respiratory:  Positive for cough, shortness of breath and wheezing.  Negative for sputum production.   Cardiovascular: Negative.   Gastrointestinal: Negative.   Genitourinary: Negative.   Musculoskeletal: Negative.   Skin: Negative.   Neurological: Negative.   Endo/Heme/Allergies: Negative.   Psychiatric/Behavioral: Negative.         Objective:   BP 120/64   Pulse 75   Ht '5\' 5"'$  (1.651 m)   Wt 160 lb (72.6 kg)   LMP  (LMP Unknown)   SpO2 99%   BMI 26.63 kg/m   Vitals:   07/16/22 0914  BP: 120/64  Pulse: 75  Height: '5\' 5"'$  (1.651 m)  Weight: 160 lb (72.6 kg)  SpO2: 99%  BMI (Calculated): 26.63    Physical Exam Vitals and nursing note  reviewed.  Constitutional:      Appearance: Normal appearance.  HENT:     Right Ear: Tympanic membrane and ear canal normal.     Left Ear: Tympanic membrane, ear canal and external ear normal.     Nose: Nose normal.  Cardiovascular:     Rate and Rhythm: Normal rate and regular rhythm.  Pulmonary:     Effort: Pulmonary effort is normal.     Breath sounds: No stridor. Wheezing present. No rhonchi or rales.  Chest:     Chest wall: No tenderness.  Abdominal:     General: Abdomen is flat. Bowel sounds are normal.     Palpations: Abdomen is soft.  Musculoskeletal:        General: Normal range of motion.     Cervical back: Normal range of motion and neck supple.  Skin:    General: Skin is warm and dry.  Neurological:     General: No focal deficit present.     Mental Status: She is alert and oriented to person, place, and time.      No results found for any visits on 07/16/22.  No results found for this or any previous visit (from the past 2160 hour(s)).    Assessment & Plan:  Fasting labs today. Continue Zyrtec. Add Flonase and Breztri inhaler. Once gets better ,will rx Albuterol rescue inhaler. Problem List Items Addressed This Visit     Hyperlipidemia   Relevant Orders   Lipid Panel w/o Chol/HDL Ratio   Essential hypertension, benign   Relevant Orders   CMP14+EGFR   Non-seasonal allergic rhinitis due to pollen - Primary   Relevant Orders   CBC With Differential   Allergy Panel 19, [employer]   GAD (generalized anxiety disorder)    Return in about 10 days (around 07/26/2022).   Total time spent: 30 minutes  Perrin Maltese, MD  07/16/2022

## 2022-07-19 LAB — CMP14+EGFR
ALT: 16 IU/L (ref 0–32)
AST: 19 IU/L (ref 0–40)
Albumin/Globulin Ratio: 2.4 — ABNORMAL HIGH (ref 1.2–2.2)
Albumin: 4.7 g/dL (ref 3.9–4.9)
Alkaline Phosphatase: 70 IU/L (ref 44–121)
BUN/Creatinine Ratio: 34 — ABNORMAL HIGH (ref 12–28)
BUN: 26 mg/dL (ref 8–27)
Bilirubin Total: 0.3 mg/dL (ref 0.0–1.2)
CO2: 23 mmol/L (ref 20–29)
Calcium: 10.1 mg/dL (ref 8.7–10.3)
Chloride: 106 mmol/L (ref 96–106)
Creatinine, Ser: 0.77 mg/dL (ref 0.57–1.00)
Globulin, Total: 2 g/dL (ref 1.5–4.5)
Glucose: 102 mg/dL — ABNORMAL HIGH (ref 70–99)
Potassium: 4.8 mmol/L (ref 3.5–5.2)
Sodium: 143 mmol/L (ref 134–144)
Total Protein: 6.7 g/dL (ref 6.0–8.5)
eGFR: 87 mL/min/{1.73_m2} (ref >59)

## 2022-07-19 LAB — CBC WITH DIFFERENTIAL
Basophils Absolute: 0 10*3/uL (ref 0.0–0.2)
Basos: 1 %
EOS (ABSOLUTE): 0.2 10*3/uL (ref 0.0–0.4)
Eos: 3 %
Hematocrit: 42.7 % (ref 34.0–46.6)
Hemoglobin: 14.2 g/dL (ref 11.1–15.9)
Immature Grans (Abs): 0 10*3/uL (ref 0.0–0.1)
Immature Granulocytes: 0 %
Lymphocytes Absolute: 1.8 10*3/uL (ref 0.7–3.1)
Lymphs: 28 %
MCH: 28.1 pg (ref 26.6–33.0)
MCHC: 33.3 g/dL (ref 31.5–35.7)
MCV: 85 fL (ref 79–97)
Monocytes Absolute: 0.3 10*3/uL (ref 0.1–0.9)
Monocytes: 5 %
Neutrophils Absolute: 4 10*3/uL (ref 1.4–7.0)
Neutrophils: 63 %
RBC: 5.05 x10E6/uL (ref 3.77–5.28)
RDW: 13.7 % (ref 11.7–15.4)
WBC: 6.4 10*3/uL (ref 3.4–10.8)

## 2022-07-19 LAB — LIPID PANEL W/O CHOL/HDL RATIO
Cholesterol, Total: 226 mg/dL — ABNORMAL HIGH (ref 100–199)
HDL: 53 mg/dL (ref >39)
LDL Chol Calc (NIH): 145 mg/dL — ABNORMAL HIGH (ref 0–99)
Triglycerides: 157 mg/dL — ABNORMAL HIGH (ref 0–149)
VLDL Cholesterol Cal: 28 mg/dL (ref 5–40)

## 2022-07-19 LAB — ALLERGY PANEL 19, SEAFOOD GROUP
Allergen Salmon IgE: <0.1 kU/L
Catfish: <0.1 kU/L
Codfish IgE: <0.1 kU/L
F023-IgE Crab: <0.1 kU/L
F080-IgE Lobster: <0.1 kU/L
Shrimp IgE: <0.1 kU/L
Tuna: <0.1 kU/L

## 2022-07-20 ENCOUNTER — Other Ambulatory Visit: Payer: Self-pay | Admitting: Internal Medicine

## 2022-07-20 DIAGNOSIS — J301 Allergic rhinitis due to pollen: Secondary | ICD-10-CM

## 2022-07-20 DIAGNOSIS — E782 Mixed hyperlipidemia: Secondary | ICD-10-CM

## 2022-07-20 MED ORDER — ATORVASTATIN CALCIUM 20 MG PO TABS: 20.0000 mg | ORAL_TABLET | Freq: Every day | ORAL | 3 refills | Status: DC

## 2022-07-26 ENCOUNTER — Encounter: Payer: Self-pay | Admitting: Internal Medicine

## 2022-07-26 ENCOUNTER — Ambulatory Visit (INDEPENDENT_AMBULATORY_CARE_PROVIDER_SITE_OTHER): Payer: Managed Care, Other (non HMO) | Admitting: Internal Medicine

## 2022-07-26 VITALS — BP 130/72 | HR 88 | Ht 64.0 in | Wt 160.0 lb

## 2022-07-26 DIAGNOSIS — J301 Allergic rhinitis due to pollen: Secondary | ICD-10-CM | POA: Diagnosis not present

## 2022-07-26 DIAGNOSIS — E782 Mixed hyperlipidemia: Secondary | ICD-10-CM

## 2022-07-26 DIAGNOSIS — T7840XA Allergy, unspecified, initial encounter: Secondary | ICD-10-CM | POA: Insufficient documentation

## 2022-07-26 DIAGNOSIS — I10 Essential (primary) hypertension: Secondary | ICD-10-CM | POA: Diagnosis not present

## 2022-07-26 DIAGNOSIS — T7840XD Allergy, unspecified, subsequent encounter: Secondary | ICD-10-CM

## 2022-07-26 MED ORDER — MONTELUKAST SODIUM 10 MG PO TABS: 10.0000 mg | ORAL_TABLET | Freq: Every day | ORAL | 3 refills | Status: DC

## 2022-07-26 MED ORDER — FLUTICASONE PROPIONATE 50 MCG/ACT NA SUSP
1.0000 | Freq: Every day | NASAL | 6 refills | Status: AC
Start: 2022-07-26 — End: ?

## 2022-07-26 NOTE — Progress Notes (Signed)
Established Patient Office Visit  Subjective:  Patient ID: Savannah Myers, female    DOB: 1960-12-20  Age: 62 y.o. MRN: [id number]  Chief Complaint  Patient presents with   Follow-up    10 day follow up    Patient comes in for her follow-up today.  She states that the [facility] inhaler is really helping her with chest congestion.  However she has a postnasal drip and sinus congestion due to allergies.  An allergy panel was ordered at last visit but still not available. She also developed hives over the weekend , but they have resolved now. Will add Singulair and Flonase to her antihistamine. Her labs did show high cholesterol so she was advised to resume her Lipitor.  She states she is taking her blood pressure medication regularly.     Past Medical History:  Diagnosis Date   CHF (congestive heart failure) (HCC)    Depression    Hypertension     Past Surgical History:  Procedure Laterality Date   ABDOMINAL HYSTERECTOMY     LEFT HEART CATH AND CORONARY ANGIOGRAPHY N/A 06/20/2020   Procedure: LEFT HEART CATH AND CORONARY ANGIOGRAPHY;  Surgeon: Isaias Cowman, MD;  Location: [place] [facility];  Service: Cardiovascular;  Laterality: N/A;    Social History   Socioeconomic History   Marital status: Married    Spouse name: Not on file   Number of children: Not on file   Years of education: Not on file   Highest education level: Not on file  Occupational History   Not on file  Tobacco Use   Smoking status: Never   Smokeless tobacco: Never  Substance and Sexual Activity   Alcohol use: No   Drug use: No   Sexual activity: Not on file  Other Topics Concern   Not on file  Social History Narrative   Not on file   Social Determinants of Health   Financial Resource Strain: Not on file  Food Insecurity: Not on file  Transportation Needs: No Transportation Needs (07/01/2020)   PRAPARE - Hydrologist (Medical): No    Lack of  Transportation (Non-Medical): No  Physical Activity: Not on file  Stress: Not on file  Social Connections: Not on file  Intimate Partner Violence: Not on file    Family History  Problem Relation Age of Onset   Breast cancer Maternal Grandmother 17    No Known Allergies  Review of Systems  Constitutional: Negative.   HENT:  Positive for congestion. Negative for ear discharge, ear pain, hearing loss and sore throat.   Eyes: Negative.   Respiratory:  Positive for cough. Negative for hemoptysis, sputum production, shortness of breath and wheezing.   Cardiovascular: Negative.   Gastrointestinal: Negative.   Genitourinary: Negative.   Musculoskeletal: Negative.   Skin:  Positive for itching and rash.  Neurological: Negative.   Endo/Heme/Allergies: Negative.   Psychiatric/Behavioral: Negative.         Objective:   BP 130/72   Pulse 88   Ht '5\' 4"'$  (1.626 m)   Wt 160 lb (72.6 kg)   LMP  (LMP Unknown)   SpO2 97%   BMI 27.46 kg/m   Vitals:   07/26/22 0935  BP: 130/72  Pulse: 88  Height: '5\' 4"'$  (1.626 m)  Weight: 160 lb (72.6 kg)  SpO2: 97%  BMI (Calculated): 27.45    Physical Exam Vitals and nursing note reviewed.  Constitutional:      Appearance: Normal  appearance.  HENT:     Head: Normocephalic.     Nose: Congestion and rhinorrhea present.  Eyes:     Conjunctiva/sclera: Conjunctivae normal.  Cardiovascular:     Rate and Rhythm: Normal rate and regular rhythm.     Pulses: Normal pulses.  Pulmonary:     Effort: Pulmonary effort is normal.     Breath sounds: Normal breath sounds. No wheezing or rhonchi.  Abdominal:     General: Abdomen is flat. Bowel sounds are normal.     Palpations: Abdomen is soft.  Musculoskeletal:        General: Normal range of motion.     Cervical back: Normal range of motion.  Neurological:     General: No focal deficit present.     Mental Status: She is alert.  Psychiatric:        Mood and Affect: Mood normal.        Behavior:  Behavior normal.      No results found for any visits on 07/26/22.  Recent Results (from the past 2160 hour(s))  Lipid Panel w/o Chol/HDL Ratio     Status: Abnormal   Collection Time: 07/16/22  9:49 AM  Result Value Ref Range   Cholesterol, Total 226 (H) 100 - 199 mg/dL   Triglycerides 157 (H) 0 - 149 mg/dL   HDL 53 >39 mg/dL   VLDL Cholesterol Cal 28 5 - 40 mg/dL   LDL Chol Calc (NIH) 145 (H) 0 - 99 mg/dL  CMP14+EGFR     Status: Abnormal   Collection Time: 07/16/22  9:49 AM  Result Value Ref Range   Glucose 102 (H) 70 - 99 mg/dL   BUN 26 8 - 27 mg/dL   Creatinine, Ser 0.77 0.57 - 1.00 mg/dL   eGFR 87 >59 mL/min/1.73   BUN/Creatinine Ratio 34 (H) 12 - 28   Sodium 143 134 - 144 mmol/L   Potassium 4.8 3.5 - 5.2 mmol/L   Chloride 106 96 - 106 mmol/L   CO2 23 20 - 29 mmol/L   Calcium 10.1 8.7 - 10.3 mg/dL   Total Protein 6.7 6.0 - 8.5 g/dL   Albumin 4.7 3.9 - 4.9 g/dL   Globulin, Total 2.0 1.5 - 4.5 g/dL   Albumin/Globulin Ratio 2.4 (H) 1.2 - 2.2   Bilirubin Total 0.3 0.0 - 1.2 mg/dL   Alkaline Phosphatase 70 44 - 121 IU/L   AST 19 0 - 40 IU/L   ALT 16 0 - 32 IU/L  CBC With Differential     Status: None   Collection Time: 07/16/22  9:49 AM  Result Value Ref Range   WBC 6.4 3.4 - 10.8 x10E3/uL   RBC 5.05 3.77 - 5.28 x10E6/uL   Hemoglobin 14.2 11.1 - 15.9 g/dL   Hematocrit 42.7 34.0 - 46.6 %   MCV 85 79 - 97 fL   MCH 28.1 26.6 - 33.0 pg   MCHC 33.3 31.5 - 35.7 g/dL   RDW 13.7 11.7 - 15.4 %   Neutrophils 63 Not Estab. %   Lymphs 28 Not Estab. %   Monocytes 5 Not Estab. %   Eos 3 Not Estab. %   Basos 1 Not Estab. %   Neutrophils Absolute 4.0 1.4 - 7.0 x10E3/uL   Lymphocytes Absolute 1.8 0.7 - 3.1 x10E3/uL   Monocytes Absolute 0.3 0.1 - 0.9 x10E3/uL   EOS (ABSOLUTE) 0.2 0.0 - 0.4 x10E3/uL   Basophils Absolute 0.0 0.0 - 0.2 x10E3/uL   Immature Granulocytes 0 Not  Estab. %   Immature Grans (Abs) 0.0 0.0 - 0.1 x10E3/uL  Allergy Panel 19, [employer]     Status: None    Collection Time: 07/16/22  9:49 AM  Result Value Ref Range   Class Description Allergens Comment     Comment:     Levels of Specific IgE       Class  Description of Class     ---------------------------  -----  --------------------                    < 0.10         0         Negative            0.10 -    0.31         0/I       Equivocal/Low            0.32 -    0.55         I         Low            0.56 -    1.40         II        Moderate            1.41 -    3.90         III       High            3.91 -   19.00         IV        Very High           19.01 -  100.00         V         Very High                   >100.00         VI        Very High    Codfish IgE <0.10 Class 0 kU/L   F023-IgE Crab <0.10 Class 0 kU/L   Shrimp IgE <0.10 Class 0 kU/L   Tuna <0.10 Class 0 kU/L   Allergen Salmon IgE <0.10 Class 0 kU/L   F080-IgE Lobster <0.10 Class 0 kU/L   Catfish <0.10 Class 0 kU/L      Assessment & Plan:  Patient will start taking Singulair tablet once a day.  Flonase nasal spray is also being sent to the pharmacy.  Will consider setting up with an allergist once results are available. Problem List Items Addressed This Visit     Hyperlipidemia   Essential hypertension, benign   Non-seasonal allergic rhinitis due to pollen   Relevant Medications   montelukast (SINGULAIR) 10 MG tablet   fluticasone (FLONASE) 50 MCG/ACT nasal spray   Other Relevant Orders   Allergens w/Comp Rflx Area 2   Allergies - Primary   Relevant Orders   Food Allergy Profile    Return in about 4 weeks (around 08/23/2022).   Total time spent: 25 minutes  Perrin Maltese, MD  07/26/2022

## 2022-07-30 LAB — FOOD ALLERGY PROFILE
Allergen Corn, IgE: <0.1 kU/L
Clam IgE: <0.1 kU/L
Codfish IgE: <0.1 kU/L
Egg White IgE: <0.1 kU/L
Milk IgE: <0.1 kU/L
Peanut IgE: <0.1 kU/L
Scallop IgE: <0.1 kU/L
Sesame Seed IgE: <0.1 kU/L
Shrimp IgE: <0.1 kU/L
Soybean IgE: <0.1 kU/L
Walnut IgE: <0.1 kU/L
Wheat IgE: <0.1 kU/L

## 2022-07-30 LAB — ALLERGENS W/COMP RFLX AREA 2
Alternaria Alternata IgE: <0.1 kU/L
Aspergillus Fumigatus IgE: <0.1 kU/L
Bermuda Grass IgE: <0.1 kU/L
Cedar, Mountain IgE: <0.1 kU/L
Cladosporium Herbarum IgE: <0.1 kU/L
Cockroach, German IgE: <0.1 kU/L
Common Silver Birch IgE: <0.1 kU/L
Cottonwood IgE: <0.1 kU/L
D Farinae IgE: 0.13 kU/L — AB
D Pteronyssinus IgE: 0.29 kU/L — AB
E001-IgE Cat Dander: <0.1 kU/L
E005-IgE Dog Dander: <0.1 kU/L
Elm, American IgE: <0.1 kU/L
IgE (Immunoglobulin E), Serum: 43 IU/mL (ref 6–495)
Johnson Grass IgE: <0.1 kU/L
Maple/Box Elder IgE: <0.1 kU/L
Mouse Urine IgE: <0.1 kU/L
Oak, White IgE: <0.1 kU/L
Pecan, Hickory IgE: <0.1 kU/L
Penicillium Chrysogen IgE: <0.1 kU/L
Pigweed, Rough IgE: <0.1 kU/L
Ragweed, Short IgE: <0.1 kU/L
Sheep Sorrel IgE Qn: <0.1 kU/L
Timothy Grass IgE: <0.1 kU/L
White Mulberry IgE: <0.1 kU/L

## 2022-08-06 ENCOUNTER — Ambulatory Visit (INDEPENDENT_AMBULATORY_CARE_PROVIDER_SITE_OTHER): Payer: Managed Care, Other (non HMO) | Admitting: Internal Medicine

## 2022-08-06 ENCOUNTER — Encounter: Payer: Self-pay | Admitting: Internal Medicine

## 2022-08-06 VITALS — BP 118/70 | HR 62 | Ht 64.0 in | Wt 159.4 lb

## 2022-08-06 DIAGNOSIS — T7840XD Allergy, unspecified, subsequent encounter: Secondary | ICD-10-CM

## 2022-08-06 DIAGNOSIS — E782 Mixed hyperlipidemia: Secondary | ICD-10-CM

## 2022-08-06 DIAGNOSIS — F411 Generalized anxiety disorder: Secondary | ICD-10-CM | POA: Diagnosis not present

## 2022-08-06 DIAGNOSIS — J301 Allergic rhinitis due to pollen: Secondary | ICD-10-CM | POA: Diagnosis not present

## 2022-08-06 DIAGNOSIS — I10 Essential (primary) hypertension: Secondary | ICD-10-CM

## 2022-08-06 MED ORDER — ALBUTEROL SULFATE HFA 108 (90 BASE) MCG/ACT IN AERS: 2.0000 | INHALATION_SPRAY | Freq: Four times a day (QID) | RESPIRATORY_TRACT | 2 refills | Status: DC | PRN

## 2022-08-06 NOTE — Progress Notes (Signed)
Established Patient Office Visit  Subjective:  Patient ID: Savannah Myers, female    DOB: 28-Jul-1960  Age: 62 y.o. MRN: [id number]  Chief Complaint  Patient presents with   Follow-up    1 month follow up    Patient comes in for her follow-up of seasonal allergies.  She reports that she is feeling much better today although she is not sure that she is taking Singulair yet.  Her husband thinks she is.   Patient does not have any further chest congestion or cough. Will send an albuterol inhaler for as needed use only.  However patient advised to continue taking her Flonase nasal spray, antihistamine, and Singulair tablets. Her allergy profile was unremarkable for environmental allergies.    No other concerns at this time.   Past Medical History:  Diagnosis Date   CHF (congestive heart failure) (HCC)    Depression    Hypertension     Past Surgical History:  Procedure Laterality Date   ABDOMINAL HYSTERECTOMY     LEFT HEART CATH AND CORONARY ANGIOGRAPHY N/A 06/20/2020   Procedure: LEFT HEART CATH AND CORONARY ANGIOGRAPHY;  Surgeon: Isaias Cowman, MD;  Location: [place] [facility];  Service: Cardiovascular;  Laterality: N/A;    Social History   Socioeconomic History   Marital status: Married    Spouse name: Not on file   Number of children: Not on file   Years of education: Not on file   Highest education level: Not on file  Occupational History   Not on file  Tobacco Use   Smoking status: Never   Smokeless tobacco: Never  Substance and Sexual Activity   Alcohol use: No   Drug use: No   Sexual activity: Not on file  Other Topics Concern   Not on file  Social History Narrative   Not on file   Social Determinants of Health   Financial Resource Strain: Not on file  Food Insecurity: Not on file  Transportation Needs: No Transportation Needs (07/01/2020)   PRAPARE - Hydrologist (Medical): No    Lack of Transportation  (Non-Medical): No  Physical Activity: Not on file  Stress: Not on file  Social Connections: Not on file  Intimate Partner Violence: Not on file    Family History  Problem Relation Age of Onset   Breast cancer Maternal Grandmother 40    No Known Allergies  Review of Systems  Constitutional:  Negative for chills, diaphoresis, fever, malaise/fatigue and weight loss.  HENT:  Negative for congestion, ear discharge, ear pain, sinus pain and sore throat.   Eyes: Negative.   Respiratory: Negative.    Genitourinary: Negative.   Musculoskeletal: Negative.   Neurological: Negative.   Psychiatric/Behavioral:  Negative for depression. The patient is not nervous/anxious.        Objective:   BP 118/70   Pulse 62   Ht 5\' 4"  (1.626 m)   Wt 159 lb 6.4 oz (72.3 kg)   LMP  (LMP Unknown)   SpO2 98%   BMI 27.36 kg/m   Vitals:   08/06/22 1042  BP: 118/70  Pulse: 62  Height: 5\' 4"  (1.626 m)  Weight: 159 lb 6.4 oz (72.3 kg)  SpO2: 98%  BMI (Calculated): 27.35    Physical Exam Vitals and nursing note reviewed.  Constitutional:      Appearance: Normal appearance.  HENT:     Nose: Nose normal. No congestion or rhinorrhea.  Eyes:  Conjunctiva/sclera: Conjunctivae normal.  Cardiovascular:     Rate and Rhythm: Normal rate and regular rhythm.     Pulses: Normal pulses.     Heart sounds: Normal heart sounds. No murmur heard. Pulmonary:     Effort: Pulmonary effort is normal.     Breath sounds: Normal breath sounds. No wheezing or rales.  Abdominal:     General: Abdomen is flat.     Palpations: Abdomen is soft.  Musculoskeletal:        General: Normal range of motion.     Cervical back: Normal range of motion.  Skin:    General: Skin is warm.  Neurological:     General: No focal deficit present.     Mental Status: She is alert and oriented to person, place, and time.      No results found for any visits on 08/06/22.  Recent Results (from the past 2160 hour(s))  Lipid  Panel w/o Chol/HDL Ratio     Status: Abnormal   Collection Time: 07/16/22  9:49 AM  Result Value Ref Range   Cholesterol, Total 226 (H) 100 - 199 mg/dL   Triglycerides 157 (H) 0 - 149 mg/dL   HDL 53 >39 mg/dL   VLDL Cholesterol Cal 28 5 - 40 mg/dL   LDL Chol Calc (NIH) 145 (H) 0 - 99 mg/dL  CMP14+EGFR     Status: Abnormal   Collection Time: 07/16/22  9:49 AM  Result Value Ref Range   Glucose 102 (H) 70 - 99 mg/dL   BUN 26 8 - 27 mg/dL   Creatinine, Ser 0.77 0.57 - 1.00 mg/dL   eGFR 87 >59 mL/min/1.73   BUN/Creatinine Ratio 34 (H) 12 - 28   Sodium 143 134 - 144 mmol/L   Potassium 4.8 3.5 - 5.2 mmol/L   Chloride 106 96 - 106 mmol/L   CO2 23 20 - 29 mmol/L   Calcium 10.1 8.7 - 10.3 mg/dL   Total Protein 6.7 6.0 - 8.5 g/dL   Albumin 4.7 3.9 - 4.9 g/dL   Globulin, Total 2.0 1.5 - 4.5 g/dL   Albumin/Globulin Ratio 2.4 (H) 1.2 - 2.2   Bilirubin Total 0.3 0.0 - 1.2 mg/dL   Alkaline Phosphatase 70 44 - 121 IU/L   AST 19 0 - 40 IU/L   ALT 16 0 - 32 IU/L  CBC With Differential     Status: None   Collection Time: 07/16/22  9:49 AM  Result Value Ref Range   WBC 6.4 3.4 - 10.8 x10E3/uL   RBC 5.05 3.77 - 5.28 x10E6/uL   Hemoglobin 14.2 11.1 - 15.9 g/dL   Hematocrit 42.7 34.0 - 46.6 %   MCV 85 79 - 97 fL   MCH 28.1 26.6 - 33.0 pg   MCHC 33.3 31.5 - 35.7 g/dL   RDW 13.7 11.7 - 15.4 %   Neutrophils 63 Not Estab. %   Lymphs 28 Not Estab. %   Monocytes 5 Not Estab. %   Eos 3 Not Estab. %   Basos 1 Not Estab. %   Neutrophils Absolute 4.0 1.4 - 7.0 x10E3/uL   Lymphocytes Absolute 1.8 0.7 - 3.1 x10E3/uL   Monocytes Absolute 0.3 0.1 - 0.9 x10E3/uL   EOS (ABSOLUTE) 0.2 0.0 - 0.4 x10E3/uL   Basophils Absolute 0.0 0.0 - 0.2 x10E3/uL   Immature Granulocytes 0 Not Estab. %   Immature Grans (Abs) 0.0 0.0 - 0.1 x10E3/uL  Allergy Panel 19, [employer]     Status: None  Collection Time: 07/16/22  9:49 AM  Result Value Ref Range   Class Description Allergens Comment     Comment:     Levels  of Specific IgE       Class  Description of Class     ---------------------------  -----  --------------------                    < 0.10         0         Negative            0.10 -    0.31         0/I       Equivocal/Low            0.32 -    0.55         I         Low            0.56 -    1.40         II        Moderate            1.41 -    3.90         III       High            3.91 -   19.00         IV        Very High           19.01 -  100.00         V         Very High                   >100.00         VI        Very High    Codfish IgE <0.10 Class 0 kU/L   F023-IgE Crab <0.10 Class 0 kU/L   Shrimp IgE <0.10 Class 0 kU/L   Tuna <0.10 Class 0 kU/L   Allergen Salmon IgE <0.10 Class 0 kU/L   F080-IgE Lobster <0.10 Class 0 kU/L   Catfish <0.10 Class 0 kU/L  Allergens w/Comp Rflx Area 2     Status: Abnormal   Collection Time: 07/26/22 10:23 AM  Result Value Ref Range   Class Description Allergens Comment     Comment:     Levels of Specific IgE       Class  Description of Class     ---------------------------  -----  --------------------                    < 0.10         0         Negative            0.10 -    0.31         0/I       Equivocal/Low            0.32 -    0.55         I         Low            0.56 -    1.40         II        Moderate  1.41 -    3.90         III       High            3.91 -   19.00         IV        Very High           19.01 -  100.00         V         Very High                   >100.00         VI        Very High    IgE (Immunoglobulin E), Serum 43 6 - 495 IU/mL   D Pteronyssinus IgE 0.29 (A) Class 0/I kU/L   D Farinae IgE 0.13 (A) Class 0/I kU/L   E001-IgE Cat Dander <0.10 Class 0 kU/L   E005-IgE Dog Dander <0.10 Class 0 kU/L   [place] Grass IgE <0.10 Class 0 kU/L   Timothy Grass IgE <0.10 Class 0 kU/L   Johnson Grass IgE <0.10 Class 0 kU/L   Cockroach, German IgE <0.10 Class 0 kU/L   Penicillium Chrysogen IgE <0.10 Class 0 kU/L    Cladosporium Herbarum IgE <0.10 Class 0 kU/L   Aspergillus Fumigatus IgE <0.10 Class 0 kU/L   Alternaria Alternata IgE <0.10 Class 0 kU/L   Maple/Box Elder IgE <0.10 Class 0 kU/L   Common Silver Wendee Copp IgE <0.10 Class 0 kU/L   Cedar, [place] IgE <0.10 Class 0 kU/L   Oak, White IgE <0.10 Class 0 kU/L   Elm, American IgE <0.10 Class 0 kU/L   Cottonwood IgE <0.10 Class 0 kU/L   Pecan, Hickory IgE <0.10 Class 0 kU/L   White Mulberry IgE <0.10 Class 0 kU/L   Ragweed, Short IgE <0.10 Class 0 kU/L   Pigweed, Rough IgE <0.10 Class 0 kU/L   Sheep Sorrel IgE Qn <0.10 Class 0 kU/L   Mouse Urine IgE <0.10 Class 0 kU/L  Food Allergy Profile     Status: None   Collection Time: 07/26/22 10:23 AM  Result Value Ref Range   Egg White IgE <0.10 Class 0 kU/L   Peanut IgE <0.10 Class 0 kU/L   Soybean IgE <0.10 Class 0 kU/L   Milk IgE <0.10 Class 0 kU/L   Clam IgE <0.10 Class 0 kU/L   Shrimp IgE <0.10 Class 0 kU/L   Walnut IgE <0.10 Class 0 kU/L   Codfish IgE <0.10 Class 0 kU/L   Scallop IgE <0.10 Class 0 kU/L   Wheat IgE <0.10 Class 0 kU/L   Allergen Corn, IgE <0.10 Class 0 kU/L   Sesame Seed IgE <0.10 Class 0 kU/L      Assessment & Plan:  Patient will continue all her medications.  Will check her labs at next visit for lipids. Problem List Items Addressed This Visit     Hyperlipidemia   Essential hypertension, benign   Non-seasonal allergic rhinitis due to pollen   GAD (generalized anxiety disorder)   Allergies - Primary    Return in about 2 months (around 10/06/2022).   Total time spent: 30 minutes  Perrin Maltese, MD  08/06/2022

## 2022-08-27 ENCOUNTER — Ambulatory Visit: Payer: Self-pay | Admitting: Internal Medicine

## 2022-09-12 ENCOUNTER — Other Ambulatory Visit: Payer: Self-pay | Admitting: Cardiovascular Disease

## 2022-09-12 DIAGNOSIS — E782 Mixed hyperlipidemia: Secondary | ICD-10-CM

## 2022-10-08 ENCOUNTER — Other Ambulatory Visit: Payer: Self-pay | Admitting: Cardiovascular Disease

## 2022-10-08 DIAGNOSIS — E782 Mixed hyperlipidemia: Secondary | ICD-10-CM

## 2022-10-26 ENCOUNTER — Ambulatory Visit: Payer: Self-pay | Admitting: Internal Medicine

## 2023-01-08 ENCOUNTER — Other Ambulatory Visit: Payer: Self-pay | Admitting: Cardiovascular Disease

## 2023-01-08 DIAGNOSIS — E782 Mixed hyperlipidemia: Secondary | ICD-10-CM

## 2023-01-28 ENCOUNTER — Encounter: Payer: 59 | Admitting: Internal Medicine

## 2023-02-14 ENCOUNTER — Encounter: Payer: Self-pay | Admitting: Internal Medicine

## 2023-02-14 ENCOUNTER — Ambulatory Visit (INDEPENDENT_AMBULATORY_CARE_PROVIDER_SITE_OTHER): Payer: 59 | Admitting: Internal Medicine

## 2023-02-14 VITALS — BP 110/70 | HR 67 | Ht 64.0 in | Wt 146.6 lb

## 2023-02-14 DIAGNOSIS — E782 Mixed hyperlipidemia: Secondary | ICD-10-CM

## 2023-02-14 DIAGNOSIS — F411 Generalized anxiety disorder: Secondary | ICD-10-CM | POA: Diagnosis not present

## 2023-02-14 DIAGNOSIS — I10 Essential (primary) hypertension: Secondary | ICD-10-CM | POA: Diagnosis not present

## 2023-02-14 DIAGNOSIS — N814 Uterovaginal prolapse, unspecified: Secondary | ICD-10-CM

## 2023-02-14 DIAGNOSIS — J040 Acute laryngitis: Secondary | ICD-10-CM

## 2023-02-14 DIAGNOSIS — J301 Allergic rhinitis due to pollen: Secondary | ICD-10-CM | POA: Diagnosis not present

## 2023-02-14 MED ORDER — FLUTICASONE PROPIONATE 50 MCG/ACT NA SUSP
1.0000 | Freq: Every day | NASAL | 6 refills | Status: DC
Start: 2023-02-14 — End: 2023-03-22

## 2023-02-14 MED ORDER — CETIRIZINE HCL 10 MG PO TABS: 10.0000 mg | ORAL_TABLET | Freq: Every day | ORAL | 0 refills | Status: DC

## 2023-02-14 NOTE — Progress Notes (Signed)
Established Patient Office Visit  Subjective:  Patient ID: Savannah Myers, female    DOB: 08-08-1960  Age: 62 y.o. MRN: [id number]  Chief Complaint  Patient presents with   Annual Exam    CPE    Patient comes in with reports of laryngitis for almost 1 month.  Her symptoms in September and she went to urgent care with sore throat and hoarse voice.  At that time she was prescribed antibiotic and she was tested negative for COVID, and others. Her symptoms did not get better and now she has completely lost her voice.  Patient reports that she has to speak a lot at her job.  She also suffers from severe allergies.  Recently she was started on prednisone Dosepak there is only minor improvement in her symptoms.  Requests referral to allergist.  Also needs to resume her Flonase nasal spray and Zyrtec. Recently she was evaluated for cystocele and urethral polyps.  Requests a uro-GYN referral. Patient will get fasting blood work. Needs time off from work to rest her voice. Will return in 10 days for further evaluation.    No other concerns at this time.   Past Medical History:  Diagnosis Date   CHF (congestive heart failure) (HCC)    Depression    Hypertension     Past Surgical History:  Procedure Laterality Date   ABDOMINAL HYSTERECTOMY     LEFT HEART CATH AND CORONARY ANGIOGRAPHY N/A 06/20/2020   Procedure: LEFT HEART CATH AND CORONARY ANGIOGRAPHY;  Surgeon: Marcina Millard, MD;  Location: [facility];  Service: Cardiovascular;  Laterality: N/A;    Social History   Socioeconomic History   Marital status: Married    Spouse name: Not on file   Number of children: Not on file   Years of education: Not on file   Highest education level: Not on file  Occupational History   Not on file  Tobacco Use   Smoking status: Never   Smokeless tobacco: Never  Substance and Sexual Activity   Alcohol use: No   Drug use: No   Sexual activity: Not on file  Other Topics  Concern   Not on file  Social History Narrative   Not on file   Social Determinants of Health   Financial Resource Strain: Not on file  Food Insecurity: Not on file  Transportation Needs: No Transportation Needs (07/01/2020)   PRAPARE - Administrator, Civil Service (Medical): No    Lack of Transportation (Non-Medical): No  Physical Activity: Not on file  Stress: Not on file  Social Connections: Not on file  Intimate Partner Violence: Not on file    Family History  Problem Relation Age of Onset   Breast cancer Maternal Grandmother 15    No Known Allergies  Review of Systems  Constitutional:  Positive for malaise/fatigue. Negative for chills, diaphoresis and weight loss.  HENT: Negative.  Negative for congestion, sinus pain and sore throat.   Eyes: Negative.   Respiratory: Negative.  Negative for cough and shortness of breath.   Cardiovascular: Negative.  Negative for chest pain, palpitations and leg swelling.  Gastrointestinal: Negative.  Negative for abdominal pain, blood in stool, constipation, diarrhea, heartburn, nausea and vomiting.  Genitourinary: Negative.  Negative for dysuria and flank pain.  Musculoskeletal: Negative.  Negative for joint pain and myalgias.  Skin: Negative.   Neurological: Negative.  Negative for dizziness and headaches.  Endo/Heme/Allergies: Negative.   Psychiatric/Behavioral: Negative.  Negative for depression  and suicidal ideas. The patient is not nervous/anxious.        Objective:   BP 110/70   Pulse 67   Ht 5\' 4"  (1.626 m)   Wt 146 lb 9.6 oz (66.5 kg)   LMP  (LMP Unknown)   SpO2 97%   BMI 25.16 kg/m   Vitals:   02/14/23 1133  BP: 110/70  Pulse: 67  Height: 5\' 4"  (1.626 m)  Weight: 146 lb 9.6 oz (66.5 kg)  SpO2: 97%  BMI (Calculated): 25.15    Physical Exam Vitals and nursing note reviewed.  Constitutional:      Appearance: Normal appearance.  HENT:     Head: Normocephalic and atraumatic.     Nose: Nose  normal.     Mouth/Throat:     Mouth: Mucous membranes are moist.     Pharynx: Oropharynx is clear.  Eyes:     Conjunctiva/sclera: Conjunctivae normal.     Pupils: Pupils are equal, round, and reactive to light.  Cardiovascular:     Rate and Rhythm: Normal rate and regular rhythm.     Pulses: Normal pulses.     Heart sounds: Normal heart sounds. No murmur heard. Pulmonary:     Effort: Pulmonary effort is normal.     Breath sounds: Normal breath sounds. No wheezing.  Abdominal:     General: Bowel sounds are normal.     Palpations: Abdomen is soft.     Tenderness: There is no abdominal tenderness. There is no right CVA tenderness or left CVA tenderness.  Musculoskeletal:        General: Normal range of motion.     Cervical back: Normal range of motion.     Right lower leg: No edema.     Left lower leg: No edema.  Skin:    General: Skin is warm and dry.  Neurological:     General: No focal deficit present.     Mental Status: She is alert and oriented to person, place, and time.  Psychiatric:        Mood and Affect: Mood normal.        Behavior: Behavior normal.      No results found for any visits on 02/14/23.  No results found for this or any previous visit (from the past 2160 hour(s)).    Assessment & Plan:  Continue current medications. Voice rest. Referrals sent to allergist and urogynecologist. Problem List Items Addressed This Visit     Hyperlipidemia   Relevant Orders   Lipid Panel w/o Chol/HDL Ratio   Essential hypertension, benign - Primary   Relevant Orders   CMP14+EGFR   Non-seasonal allergic rhinitis due to pollen   Relevant Medications   fluticasone (FLONASE) 50 MCG/ACT nasal spray   cetirizine (ZYRTEC ALLERGY) 10 MG tablet   Other Relevant Orders   Ambulatory referral to Allergy   CBC with Diff   GAD (generalized anxiety disorder)   Other Visit Diagnoses     Uterine prolapse       Cystocele with prolapse       Relevant Orders   Ambulatory  referral to Urogynecology   Laryngitis           Return in about 10 days (around 02/24/2023).   Total time spent: 30 minutes  Margaretann Loveless, MD  02/14/2023   This document may have been prepared by [facility] Voice Recognition software and as such may include unintentional dictation errors.

## 2023-02-15 ENCOUNTER — Other Ambulatory Visit: Payer: 59

## 2023-02-16 LAB — CMP14+EGFR
ALT: 24 [IU]/L (ref 0–32)
AST: 23 [IU]/L (ref 0–40)
Albumin: 4.8 g/dL (ref 3.9–4.9)
Alkaline Phosphatase: 79 [IU]/L (ref 44–121)
BUN/Creatinine Ratio: 39 — ABNORMAL HIGH (ref 12–28)
BUN: 27 mg/dL (ref 8–27)
Bilirubin Total: <0.2 mg/dL (ref 0.0–1.2)
CO2: 25 mmol/L (ref 20–29)
Calcium: 10.7 mg/dL — ABNORMAL HIGH (ref 8.7–10.3)
Chloride: 105 mmol/L (ref 96–106)
Creatinine, Ser: 0.69 mg/dL (ref 0.57–1.00)
Globulin, Total: 2.1 g/dL (ref 1.5–4.5)
Glucose: 102 mg/dL — ABNORMAL HIGH (ref 70–99)
Potassium: 5.3 mmol/L — ABNORMAL HIGH (ref 3.5–5.2)
Sodium: 145 mmol/L — ABNORMAL HIGH (ref 134–144)
Total Protein: 6.9 g/dL (ref 6.0–8.5)
eGFR: 97 mL/min/{1.73_m2} (ref >59)

## 2023-02-16 LAB — CBC WITH DIFFERENTIAL/PLATELET
Basophils Absolute: 0 10*3/uL (ref 0.0–0.2)
Basos: 0 %
EOS (ABSOLUTE): 0 10*3/uL (ref 0.0–0.4)
Eos: 0 %
Hematocrit: 49.4 % — ABNORMAL HIGH (ref 34.0–46.6)
Hemoglobin: 15.1 g/dL (ref 11.1–15.9)
Immature Grans (Abs): 0 10*3/uL (ref 0.0–0.1)
Immature Granulocytes: 0 %
Lymphocytes Absolute: 2.6 10*3/uL (ref 0.7–3.1)
Lymphs: 23 %
MCH: 28 pg (ref 26.6–33.0)
MCHC: 30.6 g/dL — ABNORMAL LOW (ref 31.5–35.7)
MCV: 92 fL (ref 79–97)
Monocytes Absolute: 0.5 10*3/uL (ref 0.1–0.9)
Monocytes: 4 %
Neutrophils Absolute: 8.3 10*3/uL — ABNORMAL HIGH (ref 1.4–7.0)
Neutrophils: 73 %
Platelets: 366 10*3/uL (ref 150–450)
RBC: 5.4 x10E6/uL — ABNORMAL HIGH (ref 3.77–5.28)
RDW: 14 % (ref 11.7–15.4)
WBC: 11.5 10*3/uL — ABNORMAL HIGH (ref 3.4–10.8)

## 2023-02-16 LAB — LIPID PANEL W/O CHOL/HDL RATIO
Cholesterol, Total: 227 mg/dL — ABNORMAL HIGH (ref 100–199)
HDL: 68 mg/dL (ref >39)
LDL Chol Calc (NIH): 146 mg/dL — ABNORMAL HIGH (ref 0–99)
Triglycerides: 77 mg/dL (ref 0–149)
VLDL Cholesterol Cal: 13 mg/dL (ref 5–40)

## 2023-02-25 ENCOUNTER — Ambulatory Visit: Payer: 59 | Admitting: Internal Medicine

## 2023-02-25 ENCOUNTER — Encounter: Payer: Self-pay | Admitting: Internal Medicine

## 2023-02-25 VITALS — BP 122/82 | HR 75 | Ht 64.0 in | Wt 147.2 lb

## 2023-02-25 DIAGNOSIS — J040 Acute laryngitis: Secondary | ICD-10-CM | POA: Diagnosis not present

## 2023-02-25 DIAGNOSIS — E782 Mixed hyperlipidemia: Secondary | ICD-10-CM | POA: Diagnosis not present

## 2023-02-25 DIAGNOSIS — F411 Generalized anxiety disorder: Secondary | ICD-10-CM | POA: Diagnosis not present

## 2023-02-25 MED ORDER — ATORVASTATIN CALCIUM 20 MG PO TABS: 20.0000 mg | ORAL_TABLET | Freq: Every day | ORAL | 3 refills | Status: DC

## 2023-02-25 NOTE — Progress Notes (Signed)
Established Patient Office Visit  Subjective:  Patient ID: Savannah Myers, female    DOB: 06/21/1960  Age: 62 y.o. MRN: [id number]  Chief Complaint  Patient presents with   Follow-up    10 day follow up    Patient is here for the follow-up of her laryngitis.  Her voice has improved but still gets worse towards the evening. Will set up an ENT referral. Labs discussed, will repeat electrolytes today. LDL was very high, admits to not taking her Lipitor regularly, will start now.    No other concerns at this time.   Past Medical History:  Diagnosis Date   CHF (congestive heart failure) (HCC)    Depression    Hypertension     Past Surgical History:  Procedure Laterality Date   ABDOMINAL HYSTERECTOMY     LEFT HEART CATH AND CORONARY ANGIOGRAPHY N/A 06/20/2020   Procedure: LEFT HEART CATH AND CORONARY ANGIOGRAPHY;  Surgeon: Marcina Millard, MD;  Location: [facility] INV[facility]IVE CV [facility];  Service: Cardiovascular;  Laterality: N/A;    Social History   Socioeconomic History   Marital status: Married    Spouse name: Not on file   Number of children: Not on file   Years of education: Not on file   Highest education level: Not on file  Occupational History   Not on file  Tobacco Use   Smoking status: Never   Smokeless tobacco: Never  Substance and Sexual Activity   Alcohol use: No   Drug use: No   Sexual activity: Not on file  Other Topics Concern   Not on file  Social History Narrative   Not on file   Social Determinants of Health   Financial Resource Strain: Not on file  Food Insecurity: Not on file  Transportation Needs: No Transportation Needs (07/01/2020)   PRAPARE - Administrator, Civil Service (Medical): No    Lack of Transportation (Non-Medical): No  Physical Activity: Not on file  Stress: Not on file  Social Connections: Not on file  Intimate Partner Violence: Not on file    Family History  Problem Relation Age of Onset   Breast cancer  Maternal Grandmother 70    No Known Allergies  Review of Systems  Constitutional: Negative.  Negative for chills, fever, malaise/fatigue and weight loss.  HENT: Negative.  Negative for congestion, sinus pain and sore throat.   Eyes: Negative.   Respiratory: Negative.  Negative for cough and shortness of breath.   Cardiovascular: Negative.  Negative for chest pain, palpitations and leg swelling.  Gastrointestinal: Negative.  Negative for abdominal pain, constipation, diarrhea, heartburn, nausea and vomiting.  Genitourinary: Negative.  Negative for dysuria and flank pain.  Musculoskeletal: Negative.  Negative for joint pain and myalgias.  Skin: Negative.   Neurological: Negative.  Negative for dizziness and headaches.  Endo/Heme/Allergies: Negative.   Psychiatric/Behavioral: Negative.  Negative for depression and suicidal ideas. The patient is not nervous/anxious.        Objective:   BP 122/82   Pulse 75   Ht 5\' 4"  (1.626 m)   Wt 147 lb 3.2 oz (66.8 kg)   LMP  (LMP Unknown)   SpO2 97%   BMI 25.27 kg/m   Vitals:   02/25/23 1443  BP: 122/82  Pulse: 75  Height: 5\' 4"  (1.626 m)  Weight: 147 lb 3.2 oz (66.8 kg)  SpO2: 97%  BMI (Calculated): 25.25    Physical Exam Vitals and nursing note reviewed.  Constitutional:  Appearance: Normal appearance.  HENT:     Head: Normocephalic and atraumatic.     Nose: Nose normal.     Mouth/Throat:     Mouth: Mucous membranes are moist.     Pharynx: Oropharynx is clear.  Eyes:     Conjunctiva/sclera: Conjunctivae normal.     Pupils: Pupils are equal, round, and reactive to light.  Cardiovascular:     Rate and Rhythm: Normal rate and regular rhythm.     Pulses: Normal pulses.     Heart sounds: Normal heart sounds. No murmur heard. Pulmonary:     Effort: Pulmonary effort is normal.     Breath sounds: Normal breath sounds. No wheezing.  Abdominal:     General: Bowel sounds are normal.     Palpations: Abdomen is soft.      Tenderness: There is no abdominal tenderness. There is no right CVA tenderness or left CVA tenderness.  Musculoskeletal:        General: Normal range of motion.     Cervical back: Normal range of motion.     Right lower leg: No edema.     Left lower leg: No edema.  Skin:    General: Skin is warm and dry.  Neurological:     General: No focal deficit present.     Mental Status: She is alert and oriented to person, place, and time.  Psychiatric:        Mood and Affect: Mood normal.        Behavior: Behavior normal.      No results found for any visits on 02/25/23.  Recent Results (from the past 2160 hour(s))  CBC with Diff     Status: Abnormal   Collection Time: 02/15/23  8:35 AM  Result Value Ref Range   WBC 11.5 (H) 3.4 - 10.8 x10E3/uL   RBC 5.40 (H) 3.77 - 5.28 x10E6/uL   Hemoglobin 15.1 11.1 - 15.9 g/dL   Hematocrit 16.1 (H) 09.6 - 46.6 %   MCV 92 79 - 97 fL   MCH 28.0 26.6 - 33.0 pg   MCHC 30.6 (L) 31.5 - 35.7 g/dL   RDW 04.5 40.9 - 81.1 %   Platelets 366 150 - 450 x10E3/uL   Neutrophils 73 Not Estab. %   Lymphs 23 Not Estab. %   Monocytes 4 Not Estab. %   Eos 0 Not Estab. %   Basos 0 Not Estab. %   Neutrophils Absolute 8.3 (H) 1.4 - 7.0 x10E3/uL   Lymphocytes Absolute 2.6 0.7 - 3.1 x10E3/uL   Monocytes Absolute 0.5 0.1 - 0.9 x10E3/uL   EOS (ABSOLUTE) 0.0 0.0 - 0.4 x10E3/uL   Basophils Absolute 0.0 0.0 - 0.2 x10E3/uL   Immature Granulocytes 0 Not Estab. %   Immature Grans (Abs) 0.0 0.0 - 0.1 x10E3/uL  CMP14+EGFR     Status: Abnormal   Collection Time: 02/15/23  8:35 AM  Result Value Ref Range   Glucose 102 (H) 70 - 99 mg/dL   BUN 27 8 - 27 mg/dL   Creatinine, Ser 9.14 0.57 - 1.00 mg/dL   eGFR 97 >78 GN/FAO/1.30   BUN/Creatinine Ratio 39 (H) 12 - 28   Sodium 145 (H) 134 - 144 mmol/L   Potassium 5.3 (H) 3.5 - 5.2 mmol/L   Chloride 105 96 - 106 mmol/L   CO2 25 20 - 29 mmol/L   Calcium 10.7 (H) 8.7 - 10.3 mg/dL   Total Protein 6.9 6.0 - 8.5 g/dL   Albumin 4.8  3.9 - 4.9 g/dL   Globulin, Total 2.1 1.5 - 4.5 g/dL   Bilirubin Total <3.2 0.0 - 1.2 mg/dL   Alkaline Phosphatase 79 44 - 121 IU/L   AST 23 0 - 40 IU/L   ALT 24 0 - 32 IU/L  Lipid Panel w/o Chol/HDL Ratio     Status: Abnormal   Collection Time: 02/15/23  8:35 AM  Result Value Ref Range   Cholesterol, Total 227 (H) 100 - 199 mg/dL   Triglycerides 77 0 - 149 mg/dL   HDL 68 >44 mg/dL   VLDL Cholesterol Cal 13 5 - 40 mg/dL   LDL Chol Calc (NIH) 010 (H) 0 - 99 mg/dL      Assessment & Plan:  Schedule ENT referral for voice hoarseness. Patient advised to take her Lipitor regularly. Continue other medications. Problem List Items Addressed This Visit     Hyperlipidemia - Primary   Relevant Medications   atorvastatin (LIPITOR) 20 MG tablet   GAD (generalized anxiety disorder)   Other Visit Diagnoses     Laryngitis           Return in about 3 months (around 05/28/2023).   Total time spent: 25 minutes  Margaretann Loveless, MD  02/25/2023   This document may have been prepared by [facility] Voice Recognition software and as such may include unintentional dictation errors.

## 2023-03-21 NOTE — Progress Notes (Addendum)
New Patient Evaluation and Consultation  Referring Provider: Margaretann Loveless, MD PCP: Margaretann Loveless, MD Date of Service: 03/22/2023  SUBJECTIVE Chief Complaint: New Patient (Initial Visit) Savannah Myers is a 62 y.o. female here today for pelvic organ prolapse.)  History of Present Illness: Savannah Myers is a 62 y.o. hispanic female seen in consultation at the request of Dr Welton Flakes for evaluation of pelvic organ prolapse and urethral polyp.    Pt reports starting vaginal estrogen since around 12/2022, uses twice a week Rx mirabegron 25mg , pt did not start due to concerns of HTN History of fall in 2018 Prior evaluation by Dr. Feliberto Gottron in 2022   Review of records significant for: History of PVCs and diastolic dysfunction, lumbar strain, prediabetes   Urinary Symptoms: Reports leakage with [place] when she [place]'t have access to bathroom in 2023, managed by avoiding fluid intake  Leakage with cough 1x/month since 4 years ago She is bothered by leakage  Day time voids 5.  Nocturia: 0-1 times per night to void with bilateral LE edema with CHF, insomnia Voiding dysfunction:  does not empty bladder well.  Patient does not use a catheter to empty bladder.  When urinating, patient feels difficulty starting urine stream and dribbling after finishing Drinks: 17oz x 7-8  water bottles per day  UTIs: 1 UTI's in the last year.  Reports symptoms of viscous vaginal discharge, resolved after antibiotics treated by GYN in [place]  Reports history of blood in urine during "UTI/vaginal discharge" in [place] No results found for the last 90 days.   Pelvic Organ Prolapse Symptoms:                  Patient Admits to a feeling of a bulge the vaginal area. It has been present for 3 years.  Patient Admits to seeing a bulge.  This bulge is bothersome.  Bowel Symptom: Bowel movements: 1 time(s) per day with history of enteritis  Stool consistency: soft  Straining: no.   Splinting: no.  Incomplete evacuation: no.  Patient Denies accidental bowel leakage / fecal incontinence Bowel regimen:  natural supplement, magnesium Last colonoscopy: Date 02/07/16 at [facility], Results normal per pt and due to return in 10 years HM Colonoscopy          Overdue - Colonoscopy (Every 10 Years) Never done    No completion history exists for this topic.            Sexual Function Sexually active: yes.  Sexual orientation: Straight Pain with sex: at the vaginal opening since hysterectomy due to dryness   Pelvic Pain Denies pelvic pain  Past Medical History:  Past Medical History:  Diagnosis Date   CHF (congestive heart failure) (HCC)    Depression    Hypertension      Past Surgical History:   Past Surgical History:  Procedure Laterality Date   ABDOMINAL HYSTERECTOMY     with RSO   LEFT HEART CATH AND CORONARY ANGIOGRAPHY N/A 06/20/2020   Procedure: LEFT HEART CATH AND CORONARY ANGIOGRAPHY;  Surgeon: Marcina Millard, MD;  Location: [facility] INVASIVE CV LAB;  Service: Cardiovascular;  Laterality: N/A;   OVARY SURGERY     tumor removal   SALPINGOOPHORECTOMY Left 1986   benign ovarian mass     Past OB/GYN History: OB History  Gravida Para Term Preterm AB Living  3 3 3     3   SAB IAB Ectopic Multiple Live Births  3    # Outcome Date GA Lbr Len/2nd Weight Sex Type Anes PTL Lv  3 Term     F Vag-Spont   LIV  2 Term     M Vag-Spont   LIV  1 Term     F Vag-Spont   LIV    Vaginal deliveries: 3,  Forceps/ Vacuum deliveries: 0, Cesarean section: 0 Menopausal: Yes, at age 24, Admits to vaginal bleeding since menopause s/p TAH BSO 10/2011 for postmenopausal bleeding and fibroids. Denies bleeding since surgery. HRT in [place], stopped in 2016 Contraception: s/p TAH BSO with midline vertical incision Last pap smear was 10/19/16 NILM neg HPV.  Any history of abnormal pap smears: no. No results found for: "DIAGPAP", "HPVHIGH", "ADEQPAP"  Medications:  Patient has a current medication list which includes the following prescription(s): cholecalciferol, melatonin, multiple vitamin, OVER THE COUNTER MEDICATION, telmisartan, valerian, gemtesa, and gemtesa.   Allergies: Patient has No Known Allergies.   Social History:  Social History   Tobacco Use   Smoking status: Never   Smokeless tobacco: Never  Vaping Use   Vaping status: Never Used  Substance Use Topics   Alcohol use: No   Drug use: No    Relationship status: married Patient lives with her husband.   Patient is employed a Runner, broadcasting/film/video. Regular exercise: No History of abuse: No  Family History:   Family History  Problem Relation Age of Onset   Breast cancer Maternal Grandmother 59     Review of Systems: Review of Systems  Constitutional:  Negative for fever, malaise/fatigue and weight loss.  Respiratory:  Positive for cough. Negative for shortness of breath and wheezing.   Cardiovascular:  Negative for chest pain, palpitations and leg swelling.  Gastrointestinal:  Negative for abdominal pain and blood in stool.  Genitourinary:  Negative for hematuria.  Skin:  Negative for rash.  Neurological:  Negative for dizziness, weakness and headaches.  Endo/Heme/Allergies:  Does not bruise/bleed easily.  Psychiatric/Behavioral:  Negative for depression. The patient is not nervous/anxious.      OBJECTIVE Physical Exam: Vitals:   03/22/23 1348  BP: 106/68  Pulse: 60  Weight: 144 lb (65.3 kg)  Height: 5' 4.06" (1.627 m)    Physical Exam Constitutional:      General: She is not in acute distress.    Appearance: Normal appearance.  Genitourinary:     Bladder and urethral meatus normal.     No lesions in the vagina.     Genitourinary Comments: 3-66mm area of polypoid lesion from posterior urethral meatus, no bleeding, pain or discharge     Right Labia: No rash, tenderness, lesions, skin changes or Bartholin's cyst.    Left Labia: No tenderness, lesions, skin changes,  Bartholin's cyst or rash.       No vaginal discharge, erythema, tenderness, bleeding, ulceration or granulation tissue.     Anterior, posterior and apical vaginal prolapse present.    Mild vaginal atrophy present.     Right Adnexa: not tender, not full and no mass present.    Left Adnexa: not tender, not full and no mass present.    Cervix is absent.     Uterus is absent.     Urethral meatus caruncle not present.    No urethral prolapse, tenderness, mass, hypermobility, discharge or stress urinary incontinence with cough stress test present.     Bladder is not tender, urgency on palpation not present and masses not present.      Pelvic Floor: Levator muscle strength  is 5/5.    Levator ani not tender, obturator internus not tender, no asymmetrical contractions present and no pelvic spasms present.    Symmetrical pelvic sensation, anal wink present and BC reflex present. Cardiovascular:     Rate and Rhythm: Normal rate.  Pulmonary:     Effort: Pulmonary effort is normal. No respiratory distress.  Abdominal:     General: There is no distension.     Palpations: There is no mass.     Tenderness: There is no abdominal tenderness.     Hernia: No hernia is present.    Neurological:     Mental Status: She is alert.  Vitals reviewed. Exam conducted with a chaperone present.    POP-Q:   POP-Q  -2                                            Aa   -2                                           Ba  -5                                              C   3                                            Gh  2                                            Pb  7                                            tvl   0                                            Ap  0                                            Bp                                                 D    Post-Void Residual (PVR) by Bladder Scan: In order to evaluate bladder emptying, we discussed obtaining a postvoid residual and  patient agreed to this procedure.  Procedure: The ultrasound unit was placed on the patient's abdomen in the suprapubic region after the patient had voided.    Post  Void Residual - 03/22/23 1404       Post Void Residual   Post Void Residual 35 mL              Laboratory Results: Lab Results  Component Value Date   COLORU yellow 03/22/2023   CLARITYU clear 03/22/2023   GLUCOSEUR Negative 03/22/2023   BILIRUBINUR Negative 03/22/2023   KETONESU Negative 03/22/2023   SPECGRAV <=1.005 (A) 03/22/2023   RBCUR Negative 03/22/2023   PHUR 8.0 03/22/2023   PROTEINUR Negative 03/22/2023   UROBILINOGEN 0.2 03/22/2023   LEUKOCYTESUR Trace (A) 03/22/2023    Lab Results  Component Value Date   CREATININE 0.69 02/15/2023   CREATININE 0.77 07/16/2022   CREATININE 0.74 06/20/2020    No results found for: "HGBA1C"  Lab Results  Component Value Date   HGB 15.1 02/15/2023     ASSESSMENT AND PLAN Ms. Gamila Wonders is a 62 y.o. with:  1. Urinary incontinence, mixed   2. Feeling of incomplete bladder emptying   3. Dyspareunia in female   4. Pelvic organ prolapse quantification stage 2 rectocele   5. Urethral caruncle     Urinary incontinence, mixed Assessment & Plan: - POCT UA + trace leuk, denies UTI symptoms. Repeat if clinical change - bladder scan WNL - drinks > 64oz water/day, encouraged fluid management - reviewed Kegel exercises with handout provided - We discussed the symptoms of overactive bladder (OAB), which include urinary urgency, urinary frequency, nocturia, with or without urge incontinence.  While we do not know the exact etiology of OAB, several treatment options exist. We discussed management including behavioral therapy (decreasing bladder irritants, urge suppression strategies, timed voids, bladder retraining), physical therapy, medication; for refractory cases posterior tibial nerve stimulation, sacral neuromodulation, and intravesical botulinum toxin  injection.  For anticholinergic medications, we discussed the potential side effects of anticholinergics including dry eyes, dry mouth, constipation, cognitive impairment and urinary retention. For Beta-3 agonist medication, we discussed the potential side effect of elevated blood pressure which is more likely to occur in individuals with uncontrolled hypertension. - Samples and Rx provided for Gemtesa - For treatment of stress urinary incontinence,  non-surgical options include expectant management, weight loss, physical therapy, as well as a pessary.  Surgical options include a midurethral sling, Burch urethropexy, and transurethral injection of a bulking agent. - continue vaginal estrogen started 12/2022   Orders: Leslye Peer; Take 1 tablet (75 mg total) by mouth daily. Leslye Peer; Take 1 tablet (75 mg total) by mouth daily.  Dispense: 30 tablet; Refill: 2  Feeling of incomplete bladder emptying Assessment & Plan: - bladder scan 35mL WNL with no sign of outlet obstruction from prolapse - repeat if clinical change   Orders: -     POCT urinalysis dipstick  Dyspareunia in female Assessment & Plan: - vaginal atrophy on exam, continue vaginal estrogen - no pelvic floor myofascial pain on exam - encouraged lubrication or barrier ointment use due to dryness per patient   Pelvic organ prolapse quantification stage 2 rectocele Assessment & Plan: - For treatment of pelvic organ prolapse, we discussed options for management including expectant management, conservative management, and surgical management, such as Kegels, a pessary, pelvic floor physical therapy, and specific surgical procedures. We discussed two options for prolapse repair:  1) vaginal repair without mesh - Pros - safer, no mesh complications - Cons - not as strong as mesh repair, higher risk of recurrence  2) laparoscopic repair with mesh -  Pros - stronger, better long-term success - Cons - risks of mesh implant  (erosion into vagina or bladder, adhering to the rectum, pain) - these risks are lower than with a vaginal mesh but still exist - patient considering sacrospinous ligament suspension. Handouts provided in Spanish for patient to consider options - start Kegel exercises   Urethral caruncle Assessment & Plan: - 3-20mm area of polypoid lesion from posterior urethral meatus - denies pain with palpation, blood in urine  - bladder scan WNL - continue vaginal estrogen and advised application over urethral meatus     Time spent: I spent 72 minutes dedicated to the care of this patient on the date of this encounter to include pre-visit review of records, face-to-face time with the patient discussing pelvic organ prolapse, mixed urinary incontinence, dyspareunia, sensation of incomplete emptying, vaginal atrophy, and post visit documentation and ordering medication/ testing.    Loleta Chance, MD

## 2023-03-22 ENCOUNTER — Ambulatory Visit: Payer: 59 | Admitting: Obstetrics

## 2023-03-22 ENCOUNTER — Encounter: Payer: Self-pay | Admitting: Obstetrics

## 2023-03-22 ENCOUNTER — Other Ambulatory Visit: Payer: Self-pay | Admitting: Obstetrics

## 2023-03-22 VITALS — BP 106/68 | HR 60 | Ht 64.06 in | Wt 144.0 lb

## 2023-03-22 DIAGNOSIS — N941 Unspecified dyspareunia: Secondary | ICD-10-CM

## 2023-03-22 DIAGNOSIS — R3914 Feeling of incomplete bladder emptying: Secondary | ICD-10-CM

## 2023-03-22 DIAGNOSIS — N816 Rectocele: Secondary | ICD-10-CM

## 2023-03-22 DIAGNOSIS — N3946 Mixed incontinence: Secondary | ICD-10-CM | POA: Diagnosis not present

## 2023-03-22 DIAGNOSIS — N362 Urethral caruncle: Secondary | ICD-10-CM

## 2023-03-22 LAB — POCT URINALYSIS DIPSTICK
Bilirubin, UA: NEGATIVE
Blood, UA: NEGATIVE
Glucose, UA: NEGATIVE
Ketones, UA: NEGATIVE
Nitrite, UA: NEGATIVE
Protein, UA: NEGATIVE
Spec Grav, UA: <=1.005 — AB (ref 1.010–1.025)
Urobilinogen, UA: 0.2 U/dL
pH, UA: 8 (ref 5.0–8.0)

## 2023-03-22 MED ORDER — GEMTESA 75 MG PO TABS
75.0000 mg | ORAL_TABLET | Freq: Every day | ORAL | Status: DC
Start: 2023-03-22 — End: 2023-03-26

## 2023-03-22 MED ORDER — GEMTESA 75 MG PO TABS
75.0000 mg | ORAL_TABLET | Freq: Every day | ORAL | 2 refills | Status: DC
Start: 2023-03-22 — End: 2023-03-26

## 2023-03-22 NOTE — Assessment & Plan Note (Signed)
-   POCT UA + trace leuk, denies UTI symptoms. Repeat if clinical change - bladder scan WNL - drinks > 64oz water/day, encouraged fluid management - reviewed Kegel exercises with handout provided - We discussed the symptoms of overactive bladder (OAB), which include urinary urgency, urinary frequency, nocturia, with or without urge incontinence.  While we do not know the exact etiology of OAB, several treatment options exist. We discussed management including behavioral therapy (decreasing bladder irritants, urge suppression strategies, timed voids, bladder retraining), physical therapy, medication; for refractory cases posterior tibial nerve stimulation, sacral neuromodulation, and intravesical botulinum toxin injection.  For anticholinergic medications, we discussed the potential side effects of anticholinergics including dry eyes, dry mouth, constipation, cognitive impairment and urinary retention. For Beta-3 agonist medication, we discussed the potential side effect of elevated blood pressure which is more likely to occur in individuals with uncontrolled hypertension. - Samples and Rx provided for Gemtesa - For treatment of stress urinary incontinence,  non-surgical options include expectant management, weight loss, physical therapy, as well as a pessary.  Surgical options include a midurethral sling, Burch urethropexy, and transurethral injection of a bulking agent. - continue vaginal estrogen started 12/2022

## 2023-03-22 NOTE — Assessment & Plan Note (Signed)
-   For treatment of pelvic organ prolapse, we discussed options for management including expectant management, conservative management, and surgical management, such as Kegels, a pessary, pelvic floor physical therapy, and specific surgical procedures. We discussed two options for prolapse repair:  1) vaginal repair without mesh - Pros - safer, no mesh complications - Cons - not as strong as mesh repair, higher risk of recurrence  2) laparoscopic repair with mesh - Pros - stronger, better long-term success - Cons - risks of mesh implant (erosion into vagina or bladder, adhering to the rectum, pain) - these risks are lower than with a vaginal mesh but still exist - patient considering sacrospinous ligament suspension. Handouts provided in Spanish for patient to consider options - start Kegel exercises

## 2023-03-22 NOTE — Assessment & Plan Note (Addendum)
-   bladder scan 35mL WNL with no sign of outlet obstruction from prolapse - repeat if clinical change

## 2023-03-22 NOTE — Patient Instructions (Signed)
You have a stage 2 (out of 4) prolapse.  We discussed the fact that it is not life threatening but there are several treatment options. For treatment of pelvic organ prolapse, we discussed options for management including expectant management, conservative management, and surgical management, such as Kegels, a pessary, pelvic floor physical therapy, and specific surgical procedures.     We discussed the symptoms of overactive bladder (OAB), which include urinary urgency, urinary frequency, night-time urination, with or without urge incontinence.  We discussed management including behavioral therapy (decreasing bladder irritants by following a bladder diet, urge suppression strategies, timed voids, bladder retraining), physical therapy, medication; and for refractory cases posterior tibial nerve stimulation, sacral neuromodulation, and intravesical botulinum toxin injection.   For Beta-3 agonist medication, we discussed the potential side effect of elevated blood pressure which is more likely to occur in individuals with uncontrolled hypertension. You were given samples for Gemtesa 75 mg.  It can take a month to start working so give it time, but if you have bothersome side effects call sooner and we can try a different medication.  Call us if you have trouble filling the prescription or if it's not covered by your insurance.  Reduce your fluid intake to around 64oz/day  For treatment of stress urinary incontinence,  non-surgical options include expectant management, weight loss, physical therapy, as well as a pessary.  Surgical options include a midurethral sling, Burch urethropexy, and transurethral injection of a bulking agent.  Continue vaginal estrogen twice a week.   Start lubrication use for dyspareunia.

## 2023-03-22 NOTE — Assessment & Plan Note (Signed)
-   vaginal atrophy on exam, continue vaginal estrogen - no pelvic floor myofascial pain on exam - encouraged lubrication or barrier ointment use due to dryness per patient

## 2023-03-22 NOTE — Assessment & Plan Note (Signed)
-   3-63mm area of polypoid lesion from posterior urethral meatus - denies pain with palpation, blood in urine  - bladder scan WNL - continue vaginal estrogen and advised application over urethral meatus

## 2023-03-26 MED ORDER — MIRABEGRON ER 50 MG PO TB24
50.0000 mg | ORAL_TABLET | Freq: Every day | ORAL | 2 refills | Status: DC
Start: 2023-04-25 — End: 2023-06-07

## 2023-03-26 NOTE — Telephone Encounter (Signed)
The pharmacy is requesting an alternative medication ( Mirabegron 25 mg)  Please advise.

## 2023-05-03 ENCOUNTER — Ambulatory Visit: Payer: 59 | Admitting: Obstetrics

## 2023-05-06 ENCOUNTER — Encounter: Payer: Self-pay | Admitting: Internal Medicine

## 2023-05-06 ENCOUNTER — Ambulatory Visit: Payer: 59 | Admitting: Internal Medicine

## 2023-05-06 VITALS — BP 120/90 | HR 56 | Ht 64.0 in | Wt 148.0 lb

## 2023-05-06 DIAGNOSIS — F411 Generalized anxiety disorder: Secondary | ICD-10-CM

## 2023-05-06 DIAGNOSIS — M109 Gout, unspecified: Secondary | ICD-10-CM | POA: Insufficient documentation

## 2023-05-06 DIAGNOSIS — E782 Mixed hyperlipidemia: Secondary | ICD-10-CM

## 2023-05-06 DIAGNOSIS — G4733 Obstructive sleep apnea (adult) (pediatric): Secondary | ICD-10-CM | POA: Insufficient documentation

## 2023-05-06 NOTE — Progress Notes (Signed)
Established Patient Office Visit  Subjective:  Patient ID: Savannah Myers, female    DOB: 03/21/1961  Age: 62 y.o. MRN: [id number]  Chief Complaint  Patient presents with   Follow-up    Sleep apnea follow up    Patient comes in for with concerns about her obstructive sleep apnea.  She had a home sleep study done in March 2023 during which she was diagnosed with mild obstructive sleep apnea.  At that time an auto CPAP was prescribed for her but patient reports that she never received it and she has not been using it still.  She does complain of snoring at night and some daytime sleepiness.  Will look into this and order will be sent again to the DME company. Patient also reports of pain and swelling in her right ankle which happened 3 days ago and then resolved spontaneously.  Previously she has been told that she might have gouty arthritis.  Will check her uric acid level today.  May need to start allopurinol.    No other concerns at this time.   Past Medical History:  Diagnosis Date   CHF (congestive heart failure) (HCC)    Depression    Hypertension     Past Surgical History:  Procedure Laterality Date   ABDOMINAL HYSTERECTOMY     with RSO   LEFT HEART CATH AND CORONARY ANGIOGRAPHY N/A 06/20/2020   Procedure: LEFT HEART CATH AND CORONARY ANGIOGRAPHY;  Surgeon: Marcina Millard, MD;  Location: [facility];  Service: Cardiovascular;  Laterality: N/A;   OVARY SURGERY     tumor removal   SALPINGOOPHORECTOMY Left 1986   benign ovarian mass    Social History   Socioeconomic History   Marital status: Married    Spouse name: Not on file   Number of children: Not on file   Years of education: Not on file   Highest education level: Not on file  Occupational History   Not on file  Tobacco Use   Smoking status: Never   Smokeless tobacco: Never  Vaping Use   Vaping status: Never Used  Substance and Sexual Activity   Alcohol use: No   Drug use: No    Sexual activity: Not Currently    Partners: Male    Birth control/protection: Surgical  Other Topics Concern   Not on file  Social History Narrative   Not on file   Social Drivers of Health   Financial Resource Strain: Not on file  Food Insecurity: Not on file  Transportation Needs: No Transportation Needs (07/01/2020)   PRAPARE - Transportation    Lack of Transportation (Medical): No    Lack of Transportation (Non-Medical): No  Physical Activity: Not on file  Stress: Not on file  Social Connections: Not on file  Intimate Partner Violence: Not on file    Family History  Problem Relation Age of Onset   Breast cancer Maternal Grandmother 6    No Known Allergies  Outpatient Medications Prior to Visit  Medication Sig   Cholecalciferol (VITAMIN D3 PO) Take by mouth.   melatonin 3 MG TABS tablet Take 6 mg by mouth at bedtime.   mirabegron ER (MYRBETRIQ) 50 MG TB24 tablet Take 1 tablet (50 mg total) by mouth daily.   Multiple Vitamin (MULTIVITAMIN ADULT PO) Take by mouth.   telmisartan (MICARDIS) 20 MG tablet TAKE 1 TABLET BY MOUTH EVERY DAY **MAKE FOLLOW UP APPT WITH DR. Steele Ledonne FOR REFILLS**   VALERIAN ROOT PO Take by  mouth.   mirabegron ER (MYRBETRIQ) 25 MG TB24 tablet Take 1 tablet (25 mg total) by mouth daily. (Patient not taking: Reported on 05/06/2023)   OVER THE COUNTER MEDICATION 5-hpt one a day (Patient not taking: Reported on 05/06/2023)   No facility-administered medications prior to visit.    Review of Systems  Constitutional:  Positive for malaise/fatigue. Negative for chills, fever and weight loss.  HENT: Negative.  Negative for sinus pain and sore throat.   Eyes: Negative.   Respiratory: Negative.  Negative for cough, shortness of breath and stridor.   Cardiovascular: Negative.  Negative for chest pain, palpitations and leg swelling.  Gastrointestinal: Negative.  Negative for abdominal pain, constipation, diarrhea, heartburn, nausea and vomiting.   Genitourinary: Negative.  Negative for dysuria and flank pain.  Musculoskeletal: Negative.  Negative for joint pain and myalgias.  Skin: Negative.   Neurological: Negative.  Negative for dizziness and headaches.  Endo/Heme/Allergies: Negative.   Psychiatric/Behavioral: Negative.  Negative for depression and suicidal ideas. The patient is not nervous/anxious.        Objective:   BP (!) 120/90   Pulse (!) 56   Ht 5\' 4"  (1.626 m)   Wt 148 lb (67.1 kg)   LMP  (LMP Unknown)   SpO2 100%   BMI 25.40 kg/m   Vitals:   05/06/23 1118  BP: (!) 120/90  Pulse: (!) 56  Height: 5\' 4"  (1.626 m)  Weight: 148 lb (67.1 kg)  SpO2: 100%  BMI (Calculated): 25.39    Physical Exam Vitals and nursing note reviewed.  Constitutional:      Appearance: Normal appearance.  HENT:     Head: Normocephalic and atraumatic.     Nose: Nose normal.     Mouth/Throat:     Mouth: Mucous membranes are moist.     Pharynx: Oropharynx is clear.  Eyes:     Conjunctiva/sclera: Conjunctivae normal.     Pupils: Pupils are equal, round, and reactive to light.  Cardiovascular:     Rate and Rhythm: Normal rate and regular rhythm.     Pulses: Normal pulses.     Heart sounds: Normal heart sounds. No murmur heard. Pulmonary:     Effort: Pulmonary effort is normal.     Breath sounds: Normal breath sounds. No wheezing.  Chest:  Breasts:    Right: Normal. No swelling, bleeding, inverted nipple, mass, nipple discharge, skin change or tenderness.     Left: Normal. No swelling, bleeding, inverted nipple, mass, nipple discharge, skin change or tenderness.  Abdominal:     General: Bowel sounds are normal.     Palpations: Abdomen is soft.     Tenderness: There is no abdominal tenderness. There is no right CVA tenderness or left CVA tenderness.  Musculoskeletal:        General: Normal range of motion.     Cervical back: Normal range of motion.     Right lower leg: No edema.     Left lower leg: No edema.   Lymphadenopathy:     Upper Body:     Right upper body: No supraclavicular, axillary or pectoral adenopathy.     Left upper body: No supraclavicular, axillary or pectoral adenopathy.  Skin:    General: Skin is warm and dry.  Neurological:     General: No focal deficit present.     Mental Status: She is alert and oriented to person, place, and time.  Psychiatric:        Mood and Affect: Mood normal.  Behavior: Behavior normal.      No results found for any visits on 05/06/23.  Recent Results (from the past 2160 hours)  CBC with Diff     Status: Abnormal   Collection Time: 02/15/23  8:35 AM  Result Value Ref Range   WBC 11.5 (H) 3.4 - 10.8 x10E3/uL   RBC 5.40 (H) 3.77 - 5.28 x10E6/uL   Hemoglobin 15.1 11.1 - 15.9 g/dL   Hematocrit 02.7 (H) 25.3 - 46.6 %   MCV 92 79 - 97 fL   MCH 28.0 26.6 - 33.0 pg   MCHC 30.6 (L) 31.5 - 35.7 g/dL   RDW 66.4 40.3 - 47.4 %   Platelets 366 150 - 450 x10E3/uL   Neutrophils 73 Not Estab. %   Lymphs 23 Not Estab. %   Monocytes 4 Not Estab. %   Eos 0 Not Estab. %   Basos 0 Not Estab. %   Neutrophils Absolute 8.3 (H) 1.4 - 7.0 x10E3/uL   Lymphocytes Absolute 2.6 0.7 - 3.1 x10E3/uL   Monocytes Absolute 0.5 0.1 - 0.9 x10E3/uL   EOS (ABSOLUTE) 0.0 0.0 - 0.4 x10E3/uL   Basophils Absolute 0.0 0.0 - 0.2 x10E3/uL   Immature Granulocytes 0 Not Estab. %   Immature Grans (Abs) 0.0 0.0 - 0.1 x10E3/uL  CMP14+EGFR     Status: Abnormal   Collection Time: 02/15/23  8:35 AM  Result Value Ref Range   Glucose 102 (H) 70 - 99 mg/dL   BUN 27 8 - 27 mg/dL   Creatinine, Ser 2.59 0.57 - 1.00 mg/dL   eGFR 97 >56 LO/VFI/4.33   BUN/Creatinine Ratio 39 (H) 12 - 28   Sodium 145 (H) 134 - 144 mmol/L   Potassium 5.3 (H) 3.5 - 5.2 mmol/L   Chloride 105 96 - 106 mmol/L   CO2 25 20 - 29 mmol/L   Calcium 10.7 (H) 8.7 - 10.3 mg/dL   Total Protein 6.9 6.0 - 8.5 g/dL   Albumin 4.8 3.9 - 4.9 g/dL   Globulin, Total 2.1 1.5 - 4.5 g/dL   Bilirubin Total <2.9 0.0 -  1.2 mg/dL   Alkaline Phosphatase 79 44 - 121 IU/L   AST 23 0 - 40 IU/L   ALT 24 0 - 32 IU/L  Lipid Panel w/o Chol/HDL Ratio     Status: Abnormal   Collection Time: 02/15/23  8:35 AM  Result Value Ref Range   Cholesterol, Total 227 (H) 100 - 199 mg/dL   Triglycerides 77 0 - 149 mg/dL   HDL 68 >51 mg/dL   VLDL Cholesterol Cal 13 5 - 40 mg/dL   LDL Chol Calc (NIH) 884 (H) 0 - 99 mg/dL  POCT Urinalysis Dipstick     Status: Abnormal   Collection Time: 03/22/23  2:13 PM  Result Value Ref Range   Color, UA yellow    Clarity, UA clear    Glucose, UA Negative Negative   Bilirubin, UA Negative    Ketones, UA Negative    Spec Grav, UA <=1.005 (A) 1.010 - 1.025   Blood, UA Negative    pH, UA 8.0 5.0 - 8.0   Protein, UA Negative Negative   Urobilinogen, UA 0.2 0.2 or 1.0 E.U./dL   Nitrite, UA Negative    Leukocytes, UA Trace (A) Negative   Appearance     Odor        Assessment & Plan:  Set up for auto CPAP. Check labs today.  Follow-up as scheduled. Problem List Items Addressed This  Visit     Hyperlipidemia   GAD (generalized anxiety disorder)   Obstructive sleep apnea - Primary   Gouty arthritis   Relevant Orders   Uric acid   CBC with Diff    Return in about 1 month (around 06/06/2023).   Total time spent: 30 minutes  Margaretann Loveless, MD  05/06/2023   This document may have been prepared by [facility] Voice Recognition software and as such may include unintentional dictation errors.

## 2023-05-07 LAB — CBC WITH DIFFERENTIAL/PLATELET
Basophils Absolute: 0 10*3/uL (ref 0.0–0.2)
Basos: 1 %
EOS (ABSOLUTE): 0.2 10*3/uL (ref 0.0–0.4)
Eos: 3 %
Hematocrit: 44 % (ref 34.0–46.6)
Hemoglobin: 14 g/dL (ref 11.1–15.9)
Immature Grans (Abs): 0 10*3/uL (ref 0.0–0.1)
Immature Granulocytes: 0 %
Lymphocytes Absolute: 2.1 10*3/uL (ref 0.7–3.1)
Lymphs: 36 %
MCH: 27.8 pg (ref 26.6–33.0)
MCHC: 31.8 g/dL (ref 31.5–35.7)
MCV: 87 fL (ref 79–97)
Monocytes Absolute: 0.3 10*3/uL (ref 0.1–0.9)
Monocytes: 6 %
Neutrophils Absolute: 3.2 10*3/uL (ref 1.4–7.0)
Neutrophils: 54 %
Platelets: 328 10*3/uL (ref 150–450)
RBC: 5.04 x10E6/uL (ref 3.77–5.28)
RDW: 13.7 % (ref 11.7–15.4)
WBC: 5.9 10*3/uL (ref 3.4–10.8)

## 2023-05-07 LAB — URIC ACID: Uric Acid: 5.1 mg/dL (ref 3.0–7.2)

## 2023-05-10 ENCOUNTER — Encounter: Payer: Self-pay | Admitting: Cardiovascular Disease

## 2023-05-13 ENCOUNTER — Telehealth: Payer: Self-pay | Admitting: Internal Medicine

## 2023-05-13 NOTE — Telephone Encounter (Signed)
Kirsten with [employer] left a VM that they do not accept this patient's insurance so we need to send the CPAP order to [facility].

## 2023-05-15 ENCOUNTER — Other Ambulatory Visit: Payer: Self-pay | Admitting: Medical Genetics

## 2023-05-17 ENCOUNTER — Other Ambulatory Visit
Admission: RE | Admit: 2023-05-17 | Discharge: 2023-05-17 | Disposition: A | Discharge status: Home / Self Care | Payer: Self-pay | Source: Ambulatory Visit | Attending: Medical Genetics | Admitting: Medical Genetics

## 2023-05-24 ENCOUNTER — Ambulatory Visit: Payer: 59 | Admitting: Obstetrics

## 2023-05-28 ENCOUNTER — Ambulatory Visit: Payer: 59 | Admitting: Internal Medicine

## 2023-06-03 LAB — GENECONNECT MOLECULAR SCREEN: Genetic Analysis Overall Interpretation: NEGATIVE

## 2023-06-06 ENCOUNTER — Ambulatory Visit: Payer: 59 | Admitting: Internal Medicine

## 2023-06-07 ENCOUNTER — Ambulatory Visit (INDEPENDENT_AMBULATORY_CARE_PROVIDER_SITE_OTHER): Payer: 59 | Admitting: Internal Medicine

## 2023-06-07 ENCOUNTER — Encounter: Payer: Self-pay | Admitting: Internal Medicine

## 2023-06-07 VITALS — BP 118/80 | HR 66 | Ht 64.0 in | Wt 148.2 lb

## 2023-06-07 DIAGNOSIS — G4733 Obstructive sleep apnea (adult) (pediatric): Secondary | ICD-10-CM

## 2023-06-07 DIAGNOSIS — J301 Allergic rhinitis due to pollen: Secondary | ICD-10-CM | POA: Diagnosis not present

## 2023-06-07 DIAGNOSIS — E559 Vitamin D deficiency, unspecified: Secondary | ICD-10-CM | POA: Insufficient documentation

## 2023-06-07 DIAGNOSIS — E782 Mixed hyperlipidemia: Secondary | ICD-10-CM

## 2023-06-07 DIAGNOSIS — I10 Essential (primary) hypertension: Secondary | ICD-10-CM

## 2023-06-07 NOTE — Progress Notes (Signed)
Established Patient Office Visit  Subjective:  Patient ID: Savannah Myers, female    DOB: 02-Mar-1961  Age: 63 y.o. MRN: [id number]  Chief Complaint  Patient presents with   Follow-up    3 month follow up    Patient comes in for her follow-up today.  She is generally feeling well.  She has still not received autotitration CPAP equipment, will check again with the DME company.  Patient has not been taking her statins and has tried to change her diet.  Her LDL was very high in the past.  We will check it again today.  She does not like to take too many medications.  Her PHQ-9/GAD score is 4/8.  But she does not want to start any medicine.    No other concerns at this time.   Past Medical History:  Diagnosis Date   CHF (congestive heart failure) (HCC)    Depression    Hypertension     Past Surgical History:  Procedure Laterality Date   ABDOMINAL HYSTERECTOMY     with RSO   LEFT HEART CATH AND CORONARY ANGIOGRAPHY N/A 06/20/2020   Procedure: LEFT HEART CATH AND CORONARY ANGIOGRAPHY;  Surgeon: Marcina Millard, MD;  Location: [facility] INV[facility] CV [facility];  Service: Cardiovascular;  Laterality: N/A;   OVARY SURGERY     tumor removal   SALPINGOOPHORECTOMY Left 1986   benign ovarian mass    Social History   Socioeconomic History   Marital status: Married    Spouse name: Not on file   Number of children: Not on file   Years of education: Not on file   Highest education level: Not on file  Occupational History   Not on file  Tobacco Use   Smoking status: Never   Smokeless tobacco: Never  Vaping Use   Vaping status: Never Used  Substance and Sexual Activity   Alcohol use: No   Drug use: No   Sexual activity: Not Currently    Partners: Male    Birth control/protection: Surgical  Other Topics Concern   Not on file  Social History Narrative   Not on file   Social Drivers of Health   Financial Resource Strain: Not on file  Food Insecurity: Not on file   Transportation Needs: No Transportation Needs (07/01/2020)   PRAPARE - Transportation    Lack of Transportation (Medical): No    Lack of Transportation (Non-Medical): No  Physical Activity: Not on file  Stress: Not on file  Social Connections: Not on file  Intimate Partner Violence: Not on file    Family History  Problem Relation Age of Onset   Breast cancer Maternal Grandmother 63    No Known Allergies  Outpatient Medications Prior to Visit  Medication Sig   Cholecalciferol (VITAMIN D3 PO) Take by mouth.   melatonin 3 MG TABS tablet Take 6 mg by mouth at bedtime.   Multiple Vitamin (MULTIVITAMIN ADULT PO) Take by mouth.   telmisartan (MICARDIS) 20 MG tablet TAKE 1 TABLET BY MOUTH EVERY DAY **MAKE FOLLOW UP APPT WITH DR. Zackrey Dyar FOR REFILLS**   VALERIAN ROOT PO Take by mouth.   [DISCONTINUED] mirabegron ER (MYRBETRIQ) 50 MG TB24 tablet Take 1 tablet (50 mg total) by mouth daily.   [DISCONTINUED] mirabegron ER (MYRBETRIQ) 25 MG TB24 tablet Take 1 tablet (25 mg total) by mouth daily. (Patient not taking: Reported on 06/07/2023)   [DISCONTINUED] OVER THE COUNTER MEDICATION 5-hpt one a day (Patient not taking: Reported on 06/07/2023)  No facility-administered medications prior to visit.    Review of Systems  Constitutional: Negative.  Negative for chills, fever and weight loss.  HENT: Negative.  Negative for congestion and sinus pain.   Eyes: Negative.   Respiratory: Negative.  Negative for cough, shortness of breath and stridor.   Cardiovascular: Negative.  Negative for chest pain, palpitations and leg swelling.  Gastrointestinal: Negative.  Negative for abdominal pain, constipation, diarrhea, heartburn, nausea and vomiting.  Genitourinary: Negative.  Negative for dysuria and flank pain.  Musculoskeletal: Negative.  Negative for joint pain and myalgias.  Skin: Negative.   Neurological: Negative.  Negative for dizziness and headaches.  Endo/Heme/Allergies: Negative.    Psychiatric/Behavioral: Negative.  Negative for depression and suicidal ideas. The patient is not nervous/anxious.        Objective:   BP 118/80   Pulse 66   Ht 5\' 4"  (1.626 m)   Wt 148 lb 3.2 oz (67.2 kg)   LMP  (LMP Unknown)   SpO2 97%   BMI 25.44 kg/m   Vitals:   06/07/23 1354  BP: 118/80  Pulse: 66  Height: 5\' 4"  (1.626 m)  Weight: 148 lb 3.2 oz (67.2 kg)  SpO2: 97%  BMI (Calculated): 25.43    Physical Exam Vitals and nursing note reviewed.  Constitutional:      Appearance: Normal appearance.  HENT:     Head: Normocephalic and atraumatic.     Nose: Nose normal.     Mouth/Throat:     Mouth: Mucous membranes are moist.     Pharynx: Oropharynx is clear.  Eyes:     Conjunctiva/sclera: Conjunctivae normal.     Pupils: Pupils are equal, round, and reactive to light.  Cardiovascular:     Rate and Rhythm: Normal rate and regular rhythm.     Pulses: Normal pulses.     Heart sounds: Normal heart sounds. No murmur heard. Pulmonary:     Effort: Pulmonary effort is normal.     Breath sounds: Normal breath sounds. No wheezing.  Abdominal:     General: Bowel sounds are normal.     Palpations: Abdomen is soft.     Tenderness: There is no abdominal tenderness. There is no right CVA tenderness or left CVA tenderness.  Musculoskeletal:        General: Normal range of motion.     Cervical back: Normal range of motion.     Right lower leg: No edema.     Left lower leg: No edema.  Skin:    General: Skin is warm and dry.  Neurological:     General: No focal deficit present.     Mental Status: She is alert and oriented to person, place, and time.  Psychiatric:        Mood and Affect: Mood normal.        Behavior: Behavior normal.      No results found for any visits on 06/07/23.  Recent Results (from the past 2160 hours)  POCT Urinalysis Dipstick     Status: Abnormal   Collection Time: 03/22/23  2:13 PM  Result Value Ref Range   Color, UA yellow    Clarity, UA  clear    Glucose, UA Negative Negative   Bilirubin, UA Negative    Ketones, UA Negative    Spec Grav, UA <=1.005 (A) 1.010 - 1.025   Blood, UA Negative    pH, UA 8.0 5.0 - 8.0   Protein, UA Negative Negative   Urobilinogen, UA 0.2 0.2  or 1.0 E.U./dL   Nitrite, UA Negative    Leukocytes, UA Trace (A) Negative   Appearance     Odor    Uric acid     Status: None   Collection Time: 05/06/23 11:48 AM  Result Value Ref Range   Uric Acid 5.1 3.0 - 7.2 mg/dL    Comment:            Therapeutic target for gout patients: <6.0  CBC with Diff     Status: None   Collection Time: 05/06/23 11:48 AM  Result Value Ref Range   WBC 5.9 3.4 - 10.8 x10E3/uL   RBC 5.04 3.77 - 5.28 x10E6/uL   Hemoglobin 14.0 11.1 - 15.9 g/dL   Hematocrit 62.1 30.8 - 46.6 %   MCV 87 79 - 97 fL   MCH 27.8 26.6 - 33.0 pg   MCHC 31.8 31.5 - 35.7 g/dL   RDW 65.7 84.6 - 96.2 %   Platelets 328 150 - 450 x10E3/uL   Neutrophils 54 Not Estab. %   Lymphs 36 Not Estab. %   Monocytes 6 Not Estab. %   Eos 3 Not Estab. %   Basos 1 Not Estab. %   Neutrophils Absolute 3.2 1.4 - 7.0 x10E3/uL   Lymphocytes Absolute 2.1 0.7 - 3.1 x10E3/uL   Monocytes Absolute 0.3 0.1 - 0.9 x10E3/uL   EOS (ABSOLUTE) 0.2 0.0 - 0.4 x10E3/uL   Basophils Absolute 0.0 0.0 - 0.2 x10E3/uL   Immature Granulocytes 0 Not Estab. %   Immature Grans (Abs) 0.0 0.0 - 0.1 x10E3/uL  GeneConnect Molecular Screen - Blood ([facility])     Status: None   Collection Time: 05/17/23  2:15 PM  Result Value Ref Range   Genetic Analysis Overall Interpretation Negative    Genetic Disease Assessed      [facility] Tier One Population Screen is a screening test that analyzes 11 genes related to hereditary breast and ovarian cancer (HBOC) syndrome, Lynch syndrome, and familial hypercholesterolemia. This test only reports clinically significant pathogenic and  likely pathogenic variants but does not report variants of uncertain significance (VUS). In addition, analysis  of the PMS2 gene excludes exons 11-15, which overlap with a known pseudogene (PMS2CL).    Genetic Analysis Report      No pathogenic or likely pathogenic variants were detected in the genes analyzed by this test.Genetic test results should be interpreted in the context of an individual's personal medical and family history. Alteration to medical management is NOT  recommended based solely on this result. Clinical correlation is advised.Additional Considerations- This is a screening test; individuals may still carry pathogenic or likely pathogenic variant(s) in the tested genes that are not detected by this test.-  For individuals at risk for these or other related conditions based on factors including personal or family history, diagnostic testing is recommended.- The absence of pathogenic or likely pathogenic variant(s) in the analyzed genes, while reassuring,  does not eliminate the possibility of a hereditary condition; there are other variants and genes associated with heart disease and hereditary cancer that are not included in this test.    Genes Tested See Notes     Comment: APOB, BRCA1, BRCA2, EPCAM, LDLR, LDLRAP1, PCSK9, PMS2, MLH1, MSH2, MSH6   Disclaimer See Notes     Comment: This test was developed and validated by [facility] This test has not been cleared or approved by the [place] (FDA). The [facility] laboratory is accredited by the  College of American Pathologists (CAP) and certified under  the Clinical Laboratory Improvement Amendments (CLIA #: [id number]) to perform high-complexity clinical tests. This test is used for clinical purposes. It should not be regarded as investigational or for research.    Sequencing Location See Notes     Comment: Sequencing done at [facility] [address], [address], [place], [place] [address] (CLIA# [id number])   Interpretation Methods and Limitations See Notes     Comment: Extracted DNA is enriched for  targeted regions and then sequenced using the [facility] Exome+ (R) assay on an Illumina DNA sequencing system. Data is then aligned to a modified version of GRCh38 and all genes are analyzed using the MANE transcript and MANE  Plus Clinical transcript, when available. Small variant calling is completed using a customized version of [employer]'s DNAseq software, augmented by a proprietary small variant caller for difficult variants. Copy number variants (CNVs) are then called  using a proprietary bioinformatics pipeline based on depth analysis with a comparison to similarly sequenced samples. Analysis of the PMS2 gene is limited to exons 1-10. The interpretation and reporting of variants in APOB, PCSK9, and LDLR is specific to  familial hypercholesterolemia; variants associated with hypobetalipoproteinemia are not included. Interpretation is based upon guidelines published by the [employer]) and the Association for  Molecular  Pathology (AMP) or their modification by [employer] when available. Interpretation is limited to the transcripts indicated on the report and +/- 10 bp into intronic regions, except as noted below. [facility] variant classifications  include pathogenic, likely pathogenic, variant of uncertain significance (VUS), likely benign, and benign. Only variants classified as pathogenic and likely pathogenic are included in the report. All reported variants are confirmed through secondary  manual inspection of DNA sequence data or orthogonal testing. Risk estimations and management guidelines included in this report are based on analysis of primary literature and recommendations of applicable professional societies, and should be regarded  as approximations.Based on validation studies, this assay delivers > 99% sensitivity and specificity for single nucleotide variants and insertions and deletions (indels) up to 20 bp. Larger indels and  complex variants are a lso reported but sensitivity  may be reduced. Based on validation studies, this assay delivers > 99% sensitivity to multi-exon CNVs and > 90% sensitivity to single-exon CNVs. This test may not detect variants in challenging regions (such as short tandem repeats, homopolymer runs, and  segment duplications), sub-exonic CNVs, chromosomal aneuploidy, or variants in the presence of mosaicism. Phasing will be attempted and reported, when possible. Structural rearrangements such as inversions, translocations, and gene conversions are not  tested in this assay unless explicitly indicated. Additionally, deep intronic, promoter, and enhancer regions may not be covered. It is important to note that this is a screening test and cannot detect all disease-causing variants. A negative result does  not guarantee the absence of a rare, undetectable variant in the genes analyzed; consider using a diagnostic test if there is significant personal and/or family history of one of the conditions analyze d by this test. Any potential incidental findings  outside of these genes and conditions will not be identified, nor reported. The results of a genetic test may be influenced by various factors, including bone marrow transplantation, blood transfusions, or in rare cases, hematolymphoid neoplasms.Gene  Specific Notes:APOB: analysis is limited to c.10580G>A and c.10579C>T; BRCA1: sequencing analysis extends to CDS +/-20 bp; BRCA2: sequencing analysis extends to CDS +/-20 bp. EPCAM:  analysis is limited to CNV of exons 8-9; LDLR: analysis includes CNV of  the promoter; MLH1: analysis includes CNV of the promoter; PMS2: analysis is limited to exons 1-10.Gardenia Phlegm, PhD, [email or web address]       Assessment & Plan:  Patient will get her lipid panel checked today.  May need to start a statin.  Continue strict diet control. Problem List Items Addressed This Visit     Hyperlipidemia   Relevant  Orders   Lipid Panel w/o Chol/HDL Ratio   Essential hypertension, benign - Primary   Relevant Orders   CMP14+EGFR   Non-seasonal allergic rhinitis due to pollen   Obstructive sleep apnea   Vitamin D deficiency   Relevant Orders   Vitamin D (25 hydroxy)    Return in about 3 months (around 09/05/2023).   Total time spent: 30 minutes  Margaretann Loveless, MD  06/07/2023   This document may have been prepared by [facility] Voice Recognition software and as such may include unintentional dictation errors.

## 2023-06-08 LAB — LIPID PANEL W/O CHOL/HDL RATIO
Cholesterol, Total: 229 mg/dL — ABNORMAL HIGH (ref 100–199)
HDL: 62 mg/dL (ref >39)
LDL Chol Calc (NIH): 142 mg/dL — ABNORMAL HIGH (ref 0–99)
Triglycerides: 139 mg/dL (ref 0–149)
VLDL Cholesterol Cal: 25 mg/dL (ref 5–40)

## 2023-06-08 LAB — CMP14+EGFR
ALT: 20 [IU]/L (ref 0–32)
AST: 24 [IU]/L (ref 0–40)
Albumin: 4.8 g/dL (ref 3.9–4.9)
Alkaline Phosphatase: 62 [IU]/L (ref 44–121)
BUN/Creatinine Ratio: 51 — ABNORMAL HIGH (ref 12–28)
BUN: 40 mg/dL — ABNORMAL HIGH (ref 8–27)
Bilirubin Total: <0.2 mg/dL (ref 0.0–1.2)
CO2: 23 mmol/L (ref 20–29)
Calcium: 10.8 mg/dL — ABNORMAL HIGH (ref 8.7–10.3)
Chloride: 101 mmol/L (ref 96–106)
Creatinine, Ser: 0.79 mg/dL (ref 0.57–1.00)
Globulin, Total: 1.6 g/dL (ref 1.5–4.5)
Glucose: 111 mg/dL — ABNORMAL HIGH (ref 70–99)
Potassium: 4.9 mmol/L (ref 3.5–5.2)
Sodium: 139 mmol/L (ref 134–144)
Total Protein: 6.4 g/dL (ref 6.0–8.5)
eGFR: 84 mL/min/{1.73_m2} (ref >59)

## 2023-06-08 LAB — VITAMIN D 25 HYDROXY (VIT D DEFICIENCY, FRACTURES): Vit D, 25-Hydroxy: 39.9 ng/mL (ref 30.0–100.0)

## 2023-06-10 ENCOUNTER — Other Ambulatory Visit: Payer: Self-pay | Admitting: Internal Medicine

## 2023-06-10 DIAGNOSIS — E782 Mixed hyperlipidemia: Secondary | ICD-10-CM

## 2023-06-10 MED ORDER — ROSUVASTATIN CALCIUM 20 MG PO TABS: 20.0000 mg | ORAL_TABLET | Freq: Every day | ORAL | 3 refills | Status: DC

## 2023-06-10 NOTE — Progress Notes (Signed)
Patient notified

## 2023-06-14 ENCOUNTER — Encounter: Payer: Self-pay | Admitting: Obstetrics

## 2023-06-14 ENCOUNTER — Ambulatory Visit: Payer: 59 | Admitting: Obstetrics

## 2023-06-14 VITALS — BP 128/69 | HR 64

## 2023-06-14 DIAGNOSIS — N816 Rectocele: Secondary | ICD-10-CM | POA: Diagnosis not present

## 2023-06-14 DIAGNOSIS — N362 Urethral caruncle: Secondary | ICD-10-CM | POA: Diagnosis not present

## 2023-06-14 DIAGNOSIS — R3914 Feeling of incomplete bladder emptying: Secondary | ICD-10-CM

## 2023-06-14 DIAGNOSIS — N3946 Mixed incontinence: Secondary | ICD-10-CM | POA: Diagnosis not present

## 2023-06-14 MED ORDER — ESTRADIOL 0.1 MG/GM VA CREA: 0.5000 g | TOPICAL_CREAM | VAGINAL | 3 refills | Status: AC

## 2023-06-14 NOTE — Assessment & Plan Note (Addendum)
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

## 2023-06-14 NOTE — Assessment & Plan Note (Signed)
-   resolve since fluid reduction, vaginal estrogen and trial of Gemtesa - POCT UA + trace leuk, denies UTI symptoms. Repeat if clinical change - prior bladder scan WNL - continue fluid management - desires referral to pelvic floor PT - We discussed the symptoms of overactive bladder (OAB), which include urinary urgency, urinary frequency, nocturia, with or without urge incontinence.  While we do not know the exact etiology of OAB, several treatment options exist. We discussed management including behavioral therapy (decreasing bladder irritants, urge suppression strategies, timed voids, bladder retraining), physical therapy, medication; for refractory cases posterior tibial nerve stimulation, sacral neuromodulation, and intravesical botulinum toxin injection.  For anticholinergic medications, we discussed the potential side effects of anticholinergics including dry eyes, dry mouth, constipation, cognitive impairment and urinary retention. For Beta-3 agonist medication, we discussed the potential side effect of elevated blood pressure which is more likely to occur in individuals with uncontrolled hypertension. - Samples and Rx provided for Gemtesa - For treatment of stress urinary incontinence,  non-surgical options include expectant management, weight loss, physical therapy, as well as a pessary.  Surgical options include a midurethral sling, Burch urethropexy, and transurethral injection of a bulking agent. - encouraged to resume vaginal estrogen

## 2023-06-14 NOTE — Assessment & Plan Note (Signed)
-   resolved since vaginal estrogen and trial of Gemteas - prior bladder scan 35mL WNL with no sign of outlet obstruction from prolapse - repeat if clinical change  - referral to pelvic floor PT

## 2023-06-14 NOTE — Assessment & Plan Note (Signed)
-   For treatment of pelvic organ prolapse, we discussed options for management including expectant management, conservative management, and surgical management, such as Kegels, a pessary, pelvic floor physical therapy, and specific surgical procedures. We previously discussed two options for prolapse repair:  1) vaginal repair without mesh - Pros - safer, no mesh complications - Cons - not as strong as mesh repair, higher risk of recurrence  2) laparoscopic repair with mesh - Pros - stronger, better long-term success - Cons - risks of mesh implant (erosion into vagina or bladder, adhering to the rectum, pain) - these risks are lower than with a vaginal mesh but still exist - patient previously considering sacrospinous ligament suspension. Handouts provided in Spanish for patient to consider options - desires to proceed with pelvic floor PT, referral sent

## 2023-06-14 NOTE — Patient Instructions (Addendum)
Please resume vaginal estrogen if your symptoms return. There is a prescription sent for you.   You can call pelvic floor PT at [phone]

## 2023-06-14 NOTE — Progress Notes (Signed)
Urogynecology Return Visit  SUBJECTIVE  History of Present Illness: Genine Beckett is a 63 y.o. female seen in follow-up for stage II pelvic organ prolapse, mixed urinary incontinence, feeling of incomplete bladder emptying, dyspareunia, and urethral caruncle. Plan at last visit was continue vaginal estrogen, trial of Gemtesa, Kegel exercises.   Spanish interpreter Glee Arvin  Discontinued vaginal estrogen after 1st tube was completed, resolution of urgency and frequency since Gemtesa. Only used samples, completed around mid December.  Fluid reduction from 17oz x 7-8  water bottles per day down to 5 bottles/day Pleased with relief Reports thin yellow discharge around 1 week ago, denies itching, bleeding, pain or malodorous discharge.  Past Medical History: Patient  has a past medical history of CHF (congestive heart failure) (HCC), Depression, Hypertension, and Urinary incontinence.   Past Surgical History: She  has a past surgical history that includes Abdominal hysterectomy; LEFT HEART CATH AND CORONARY ANGIOGRAPHY (N/A, 06/20/2020); Ovary surgery; and Salpingoophorectomy (Left, 1986).   Medications: She has a current medication list which includes the following prescription(s): atorvastatin, cholecalciferol, [START ON 06/17/2023] estradiol, melatonin, multiple vitamin, telmisartan, and valerian.   Allergies: Patient has no known allergies.   Social History: Patient  reports that she has never smoked. She has never used smokeless tobacco. She reports that she does not drink alcohol and does not use drugs.     OBJECTIVE     Physical Exam: Vitals:   06/14/23 1250  BP: 128/69  Pulse: 64   Gen: No apparent distress, A&O x 3.  Detailed Urogynecologic Evaluation:  Deferred.      ASSESSMENT AND PLAN    Ms. Prue Lingenfelter is a 64 y.o. with:  1. Pelvic organ prolapse quantification stage 2 rectocele   2. Urethral caruncle   3. Feeling of incomplete bladder  emptying   4. Urinary incontinence, mixed     Pelvic organ prolapse quantification stage 2 rectocele Assessment & Plan: - For treatment of pelvic organ prolapse, we discussed options for management including expectant management, conservative management, and surgical management, such as Kegels, a pessary, pelvic floor physical therapy, and specific surgical procedures. We previously discussed two options for prolapse repair:  1) vaginal repair without mesh - Pros - safer, no mesh complications - Cons - not as strong as mesh repair, higher risk of recurrence  2) laparoscopic repair with mesh - Pros - stronger, better long-term success - Cons - risks of mesh implant (erosion into vagina or bladder, adhering to the rectum, pain) - these risks are lower than with a vaginal mesh but still exist - patient previously considering sacrospinous ligament suspension. Handouts provided in Spanish for patient to consider options - desires to proceed with pelvic floor PT, referral sent  Orders: -     AMB referral to rehabilitation  Urethral caruncle Assessment & Plan: - 3-52mm area of polypoid lesion from posterior urethral meatus - denies pain with palpation, blood in urine  - prior bladder scan WNL with resolution of incomplete emptying sensation - encouraged to vaginal estrogen 2x/week and advised application over urethral meatus - Rx sent for vaginal estrogen  Orders: -     Estradiol; Place 0.5 g vaginally 2 (two) times a week. Place 0.5g nightly for two weeks then twice a week after  Dispense: 30 g; Refill: 3  Feeling of incomplete bladder emptying Assessment & Plan: - resolved since vaginal estrogen and trial of Gemteas - prior bladder scan 35mL WNL with no sign of outlet obstruction from  prolapse - repeat if clinical change  - referral to pelvic floor PT  Orders: -     Estradiol; Place 0.5 g vaginally 2 (two) times a week. Place 0.5g nightly for two weeks then twice a week after   Dispense: 30 g; Refill: 3 -     AMB referral to rehabilitation  Urinary incontinence, mixed Assessment & Plan: - resolve since fluid reduction, vaginal estrogen and trial of Gemtesa - POCT UA + trace leuk, denies UTI symptoms. Repeat if clinical change - prior bladder scan WNL - continue fluid management - desires referral to pelvic floor PT - We discussed the symptoms of overactive bladder (OAB), which include urinary urgency, urinary frequency, nocturia, with or without urge incontinence.  While we do not know the exact etiology of OAB, several treatment options exist. We discussed management including behavioral therapy (decreasing bladder irritants, urge suppression strategies, timed voids, bladder retraining), physical therapy, medication; for refractory cases posterior tibial nerve stimulation, sacral neuromodulation, and intravesical botulinum toxin injection.  For anticholinergic medications, we discussed the potential side effects of anticholinergics including dry eyes, dry mouth, constipation, cognitive impairment and urinary retention. For Beta-3 agonist medication, we discussed the potential side effect of elevated blood pressure which is more likely to occur in individuals with uncontrolled hypertension. - Samples and Rx provided for Gemtesa - For treatment of stress urinary incontinence,  non-surgical options include expectant management, weight loss, physical therapy, as well as a pessary.  Surgical options include a midurethral sling, Burch urethropexy, and transurethral injection of a bulking agent. - encouraged to resume vaginal estrogen  Orders: -     Estradiol; Place 0.5 g vaginally 2 (two) times a week. Place 0.5g nightly for two weeks then twice a week after  Dispense: 30 g; Refill: 3 -     AMB referral to rehabilitation  Time spent: I spent 21 minutes dedicated to the care of this patient on the date of this encounter to include pre-visit review of records, face-to-face  time with the patient discussing stage II pelvic organ prolpase, vaginal atrophy with urethral caruncle, mixed urinary incontinence, sensation of incomplete emptying  and post visit documentation and ordering medication/ testing.   Loleta Chance, MD

## 2023-09-05 ENCOUNTER — Ambulatory Visit: Payer: 59 | Admitting: Internal Medicine

## 2023-09-06 ENCOUNTER — Ambulatory Visit: Admitting: Internal Medicine

## 2023-09-09 ENCOUNTER — Encounter: Payer: Self-pay | Admitting: Internal Medicine

## 2023-09-09 ENCOUNTER — Ambulatory Visit: Admitting: Internal Medicine

## 2023-09-09 VITALS — BP 112/72 | HR 65 | Ht 64.0 in | Wt 150.4 lb

## 2023-09-09 DIAGNOSIS — I10 Essential (primary) hypertension: Secondary | ICD-10-CM

## 2023-09-09 DIAGNOSIS — R0789 Other chest pain: Secondary | ICD-10-CM

## 2023-09-09 DIAGNOSIS — G4733 Obstructive sleep apnea (adult) (pediatric): Secondary | ICD-10-CM | POA: Diagnosis not present

## 2023-09-09 DIAGNOSIS — F411 Generalized anxiety disorder: Secondary | ICD-10-CM

## 2023-09-09 DIAGNOSIS — E782 Mixed hyperlipidemia: Secondary | ICD-10-CM | POA: Diagnosis not present

## 2023-09-09 NOTE — Progress Notes (Signed)
 Established Patient Office Visit  Subjective:  Patient ID: Savannah Myers, female    DOB: 16-Jan-1961  Age: 63 y.o. MRN: [id number]  Chief Complaint  Patient presents with   Follow-up    3 month follow up    Patient comes in for her follow-up today.  She mentions occasional left-sided chest pain.  But she is not having chest pain right now, no shortness of breath and no palpitations.  Patient had a negative cardiac cath in 2022, but did not keep up follow-up with that cardiologist.  Will schedule a consultation. She is taking her statin regularly now. Missed her appointment to pick up CPAP supplies.  Will try again. Patient will return fasting for lab work.    No other concerns at this time.   Past Medical History:  Diagnosis Date   CHF (congestive heart failure) (HCC)    Depression    Hypertension    Urinary incontinence     Past Surgical History:  Procedure Laterality Date   ABDOMINAL HYSTERECTOMY     with RSO   LEFT HEART CATH AND CORONARY ANGIOGRAPHY N/A 06/20/2020   Procedure: LEFT HEART CATH AND CORONARY ANGIOGRAPHY;  Surgeon: Percival Brace, MD;  Location: [facility] INVASIVE CV LAB;  Service: Cardiovascular;  Laterality: N/A;   OVARY SURGERY     tumor removal   SALPINGOOPHORECTOMY Left 1986   benign ovarian mass    Social History   Socioeconomic History   Marital status: Married    Spouse name: Not on file   Number of children: Not on file   Years of education: Not on file   Highest education level: Not on file  Occupational History   Not on file  Tobacco Use   Smoking status: Never   Smokeless tobacco: Never  Vaping Use   Vaping status: Never Used  Substance and Sexual Activity   Alcohol use: No   Drug use: No   Sexual activity: Not Currently    Partners: Male    Birth control/protection: Surgical  Other Topics Concern   Not on file  Social History Narrative   Not on file   Social Drivers of Health   Financial Resource Strain: Not on  file  Food Insecurity: Not on file  Transportation Needs: No Transportation Needs (07/01/2020)   PRAPARE - Transportation    Lack of Transportation (Medical): No    Lack of Transportation (Non-Medical): No  Physical Activity: Not on file  Stress: Not on file  Social Connections: Not on file  Intimate Partner Violence: Not on file    Family History  Problem Relation Age of Onset   Breast cancer Maternal Grandmother 55   Bladder Cancer Neg Hx    Uterine cancer Neg Hx     No Known Allergies  Outpatient Medications Prior to Visit  Medication Sig   atorvastatin  (LIPITOR) 20 MG tablet Take 20 mg by mouth daily.   Cholecalciferol (VITAMIN D3 PO) Take by mouth.   estradiol  (ESTRACE ) 0.1 MG/GM vaginal cream Place 0.5 g vaginally 2 (two) times a week. Place 0.5g nightly for two weeks then twice a week after   melatonin 3 MG TABS tablet Take 6 mg by mouth at bedtime.   Multiple Vitamin (MULTIVITAMIN ADULT PO) Take by mouth.   telmisartan (MICARDIS) 20 MG tablet TAKE 1 TABLET BY MOUTH EVERY DAY **MAKE FOLLOW UP APPT WITH DR. Jailin Moomaw FOR REFILLS**   VALERIAN ROOT PO Take by mouth.   No facility-administered medications prior to visit.  Review of Systems  Constitutional: Negative.  Negative for chills, fever and weight loss.  HENT: Negative.  Negative for sore throat.   Eyes: Negative.   Respiratory: Negative.  Negative for cough and shortness of breath.   Cardiovascular:  Positive for chest pain. Negative for palpitations and leg swelling.  Gastrointestinal: Negative.  Negative for abdominal pain, constipation, diarrhea, heartburn, nausea and vomiting.  Genitourinary: Negative.  Negative for dysuria and flank pain.  Musculoskeletal: Negative.  Negative for joint pain and myalgias.  Skin: Negative.   Neurological: Negative.  Negative for dizziness and headaches.  Endo/Heme/Allergies: Negative.   Psychiatric/Behavioral: Negative.  Negative for depression and suicidal ideas. The patient  is not nervous/anxious.        Objective:   BP 112/72   Pulse 65   Ht 5\' 4"  (1.626 m)   Wt 150 lb 6.4 oz (68.2 kg)   LMP  (LMP Unknown)   SpO2 97%   BMI 25.82 kg/m   Vitals:   09/09/23 1136  BP: 112/72  Pulse: 65  Height: 5\' 4"  (1.626 m)  Weight: 150 lb 6.4 oz (68.2 kg)  SpO2: 97%  BMI (Calculated): 25.8    Physical Exam Vitals and nursing note reviewed.  Constitutional:      Appearance: Normal appearance.  HENT:     Head: Normocephalic and atraumatic.     Nose: Nose normal.     Mouth/Throat:     Mouth: Mucous membranes are moist.     Pharynx: Oropharynx is clear.  Eyes:     Conjunctiva/sclera: Conjunctivae normal.     Pupils: Pupils are equal, round, and reactive to light.  Cardiovascular:     Rate and Rhythm: Normal rate and regular rhythm.     Pulses: Normal pulses.     Heart sounds: Normal heart sounds. No murmur heard. Pulmonary:     Effort: Pulmonary effort is normal.     Breath sounds: Normal breath sounds. No wheezing.  Abdominal:     General: Bowel sounds are normal.     Palpations: Abdomen is soft.     Tenderness: There is no abdominal tenderness. There is no right CVA tenderness or left CVA tenderness.  Musculoskeletal:        General: Normal range of motion.     Cervical back: Normal range of motion.     Right lower leg: No edema.     Left lower leg: No edema.  Skin:    General: Skin is warm and dry.  Neurological:     General: No focal deficit present.     Mental Status: She is alert and oriented to person, place, and time.  Psychiatric:        Mood and Affect: Mood normal.        Behavior: Behavior normal.      No results found for any visits on 09/09/23.  No results found for this or any previous visit (from the past 2160 hours).    Assessment & Plan:  Continue current medications.  Return for fasting labs.  Cardiology consultation for intermittent chest pain. Problem List Items Addressed This Visit     Hyperlipidemia    Relevant Orders   Lipid Panel w/o Chol/HDL Ratio   Essential hypertension, benign - Primary   Relevant Orders   CMP14+EGFR   GAD (generalized anxiety disorder)   Obstructive sleep apnea   Other Visit Diagnoses       Other chest pain       Relevant Orders  Ambulatory referral to Cardiology       Return in about 3 months (around 12/09/2023).   Total time spent: 30 minutes  Aisha Hove, MD  09/09/2023   This document may have been prepared by [facility] Voice Recognition software and as such may include unintentional dictation errors.

## 2023-09-10 ENCOUNTER — Ambulatory Visit: Payer: 59 | Admitting: Obstetrics and Gynecology

## 2023-09-11 ENCOUNTER — Ambulatory Visit: Payer: 59 | Admitting: Obstetrics and Gynecology

## 2023-09-16 ENCOUNTER — Encounter: Payer: Self-pay | Admitting: Cardiovascular Disease

## 2023-09-16 ENCOUNTER — Ambulatory Visit: Admitting: Cardiovascular Disease

## 2023-09-16 ENCOUNTER — Other Ambulatory Visit

## 2023-09-16 VITALS — BP 112/68 | HR 57 | Ht 64.0 in | Wt 146.0 lb

## 2023-09-16 DIAGNOSIS — G4733 Obstructive sleep apnea (adult) (pediatric): Secondary | ICD-10-CM

## 2023-09-16 DIAGNOSIS — E782 Mixed hyperlipidemia: Secondary | ICD-10-CM | POA: Diagnosis not present

## 2023-09-16 DIAGNOSIS — I10 Essential (primary) hypertension: Secondary | ICD-10-CM

## 2023-09-16 DIAGNOSIS — R0789 Other chest pain: Secondary | ICD-10-CM | POA: Diagnosis not present

## 2023-09-16 NOTE — Progress Notes (Signed)
 Cardiology Office Note   Date:  09/16/2023   ID:  Savannah Myers, DOB [birth year], MRN [id number]  PCP:  Aisha Hove, MD  Cardiologist:  Debborah Fairly, MD      History of Present Illness: Savannah Myers is a 63 y.o. female who presents for  Chief Complaint  Patient presents with   Consult    Consult     This is a 63 year old Hispanic female who presented to Dr. Aisha Hove office for evaluation of chest pain.  She had pressure type chest pain which was severe left precordial associated with shortness of breath.  She had 1 episode lasting few minutes.  She has no further episodes.  She also presented to the hospital at [facility] with chest pain and was evaluated and had peak troponin over 702 and underwent cardiac catheterization by Dr. Chick Cotton, which showed normal coronaries and normal left LVEF 06/18/20 according to the notes patient ruled out for non-STEMI and was felt that elevated troponin was due to demand ischemia.  She had normal coronaries and ejection fraction 65% on echocardiogram.  Since patient ruled out for myocardial infarction and had normal LV function and normal coronaries she was appropriately referred to primary care Dr. Jerris More for further evaluation and treatment.  Patient seems upset that she thought for 3 years that Dr. Jerris More was a cardiologist and was being seen for cardiology.  I explained to her that she is a primary care doctor and she was treating you because you have medical problems, such as Hypertension.  She says that all this time in the past 3 years she thought that she had a heart attack and needed to see a cardiologist.  I explained to her that maybe  when you were  in the hospital, providers did not explain to you properly that you  did not have a heart attack in fact your coronaries were normal and your ejection fraction was normal on echocardiogram.  You were seeing a primary care doctor because heart attack was ruled  out.  After lengthy explanation  she seems like has  understanding of  all this now.  I explained to her that primary care doctors can treat things like hypertension and hyperlipidemia and other medical issues that is why you are seeing a primary care doctor.  The reason you are seeing me now is because you had an episode of chest pain.      Past Medical History:  Diagnosis Date   CHF (congestive heart failure) (HCC)    Depression    Hypertension    Urinary incontinence      Past Surgical History:  Procedure Laterality Date   ABDOMINAL HYSTERECTOMY     with RSO   LEFT HEART CATH AND CORONARY ANGIOGRAPHY N/A 06/20/2020   Procedure: LEFT HEART CATH AND CORONARY ANGIOGRAPHY;  Surgeon: Percival Brace, MD;  Location: [facility] INVASIVE CV LAB;  Service: Cardiovascular;  Laterality: N/A;   OVARY SURGERY     tumor removal   SALPINGOOPHORECTOMY Left 1986   benign ovarian mass     Current Outpatient Medications  Medication Sig Dispense Refill   atorvastatin  (LIPITOR) 20 MG tablet Take 20 mg by mouth daily.     Cholecalciferol (VITAMIN D3 PO) Take by mouth.     estradiol  (ESTRACE ) 0.1 MG/GM vaginal cream Place 0.5 g vaginally 2 (two) times a week. Place 0.5g nightly for two weeks then twice a week after 30 g 3  melatonin 3 MG TABS tablet Take 6 mg by mouth at bedtime.     Multiple Vitamin (MULTIVITAMIN ADULT PO) Take by mouth.     telmisartan (MICARDIS) 20 MG tablet TAKE 1 TABLET BY MOUTH EVERY DAY **MAKE FOLLOW UP APPT WITH DR. Cordarrius Coad FOR REFILLS** 90 tablet 1   VALERIAN ROOT PO Take by mouth.     No current facility-administered medications for this visit.    Allergies:   Patient has no known allergies.    Social History:   reports that she has never smoked. She has never used smokeless tobacco. She reports that she does not drink alcohol and does not use drugs.   Family History:  family history includes Breast cancer (age of onset: 66) in her maternal grandmother.    ROS:      ROS    All other systems are reviewed and negative.    PHYSICAL EXAM: VS:  BP 112/68   Pulse (!) 57   Ht 5\' 4"  (1.626 m)   Wt 146 lb (66.2 kg)   LMP  (LMP Unknown)   SpO2 97%   BMI 25.06 kg/m  , BMI Body mass index is 25.06 kg/m. Last weight:  Wt Readings from Last 3 Encounters:  09/16/23 146 lb (66.2 kg)  09/09/23 150 lb 6.4 oz (68.2 kg)  06/07/23 148 lb 3.2 oz (67.2 kg)     Physical Exam    EKG:   Recent Labs: 05/06/2023: Hemoglobin 14.0; Platelets 328 06/07/2023: ALT 20; BUN 40; Creatinine, Ser 0.79; Potassium 4.9; Sodium 139    Lipid Panel    Component Value Date/Time   CHOL 229 (H) 06/07/2023 1416   TRIG 139 06/07/2023 1416   HDL 62 06/07/2023 1416   CHOLHDL 2.9 06/18/2020 0018   VLDL 9 06/18/2020 0018   LDLCALC 142 (H) 06/07/2023 1416      Other studies Reviewed: Additional studies/ records that were reviewed today include:  Review of the above records demonstrates:       No data to display            ASSESSMENT AND PLAN:    ICD-10-CM   1. Obstructive sleep apnea  G47.33 PCV ECHOCARDIOGRAM COMPLETE    MYOCARDIAL PERFUSION IMAGING    2. Mixed hyperlipidemia  E78.2 PCV ECHOCARDIOGRAM COMPLETE    MYOCARDIAL PERFUSION IMAGING    3. Essential hypertension, benign  I10 PCV ECHOCARDIOGRAM COMPLETE    MYOCARDIAL PERFUSION IMAGING    4. Other chest pain  R07.89 PCV ECHOCARDIOGRAM COMPLETE    MYOCARDIAL PERFUSION IMAGING   Had anginal type of chest pain,advise echo, stress test. EKG shows sinus bradycardia 54/min .  Patient has agreed to having echo and a stress test.       Problem List Items Addressed This Visit       Cardiovascular and Mediastinum   Essential hypertension, benign   Relevant Orders   PCV ECHOCARDIOGRAM COMPLETE   MYOCARDIAL PERFUSION IMAGING     Respiratory   Obstructive sleep apnea - Primary   Relevant Orders   PCV ECHOCARDIOGRAM COMPLETE   MYOCARDIAL PERFUSION IMAGING     Other   Hyperlipidemia   Relevant  Orders   PCV ECHOCARDIOGRAM COMPLETE   MYOCARDIAL PERFUSION IMAGING   Other Visit Diagnoses       Other chest pain       Had anginal type of chest pain,advise echo, stress test. EKG shows sinus bradycardia 54/min .  Patient has agreed to having echo and a stress test.  Relevant Orders   PCV ECHOCARDIOGRAM COMPLETE   MYOCARDIAL PERFUSION IMAGING          Disposition:   Return in about 3 weeks (around 10/07/2023) for echo, stress test and f/u.    Total time spent: 50 minutes  Signed,  Debborah Fairly, MD  09/16/2023 2:37 PM    [facility]

## 2023-09-16 NOTE — Progress Notes (Signed)
 Cardiology Office Note   Date:  09/16/2023   ID:  Savannah Myers, DOB 12/02/1960, MRN [id number]  PCP:  Aisha Hove, MD  Cardiologist:  Debborah Fairly, MD      History of Present Illness: Savannah Myers is a 63 y.o. female who presents for  Chief Complaint  Patient presents with   Consult    Consult     Had chest pain  Chest Pain  This is a new problem. The current episode started in the past 7 days. The onset quality is sudden. The problem occurs intermittently. The problem has been resolved. The pain is at a severity of 6/10. The pain is severe. The quality of the pain is described as tightness.      Past Medical History:  Diagnosis Date   CHF (congestive heart failure) (HCC)    Depression    Hypertension    Urinary incontinence      Past Surgical History:  Procedure Laterality Date   ABDOMINAL HYSTERECTOMY     with RSO   LEFT HEART CATH AND CORONARY ANGIOGRAPHY N/A 06/20/2020   Procedure: LEFT HEART CATH AND CORONARY ANGIOGRAPHY;  Surgeon: Percival Brace, MD;  Location: [facility];  Service: Cardiovascular;  Laterality: N/A;   OVARY SURGERY     tumor removal   SALPINGOOPHORECTOMY Left 1986   benign ovarian mass     Current Outpatient Medications  Medication Sig Dispense Refill   atorvastatin  (LIPITOR) 20 MG tablet Take 20 mg by mouth daily.     Cholecalciferol (VITAMIN D3 PO) Take by mouth.     estradiol  (ESTRACE ) 0.1 MG/GM vaginal cream Place 0.5 g vaginally 2 (two) times a week. Place 0.5g nightly for two weeks then twice a week after 30 g 3   melatonin 3 MG TABS tablet Take 6 mg by mouth at bedtime.     Multiple Vitamin (MULTIVITAMIN ADULT PO) Take by mouth.     telmisartan (MICARDIS) 20 MG tablet TAKE 1 TABLET BY MOUTH EVERY DAY **MAKE FOLLOW UP APPT WITH DR. Arieh Bogue FOR REFILLS** 90 tablet 1   VALERIAN ROOT PO Take by mouth.     No current facility-administered medications for this visit.    Allergies:   Patient has no  known allergies.    Social History:   reports that she has never smoked. She has never used smokeless tobacco. She reports that she does not drink alcohol and does not use drugs.   Family History:  family history includes Breast cancer (age of onset: 35) in her maternal grandmother.    ROS:     Review of Systems  Constitutional: Negative.   HENT: Negative.    Eyes: Negative.   Respiratory: Negative.    Cardiovascular:  Positive for chest pain.  Gastrointestinal: Negative.   Genitourinary: Negative.   Musculoskeletal: Negative.   Skin: Negative.   Neurological: Negative.   Endo/Heme/Allergies: Negative.   Psychiatric/Behavioral: Negative.    All other systems reviewed and are negative.     All other systems are reviewed and negative.    PHYSICAL EXAM: VS:  BP 112/68   Pulse (!) 57   Ht 5\' 4"  (1.626 m)   Wt 146 lb (66.2 kg)   LMP  (LMP Unknown)   SpO2 97%   BMI 25.06 kg/m  , BMI Body mass index is 25.06 kg/m. Last weight:  Wt Readings from Last 3 Encounters:  09/16/23 146 lb (66.2 kg)  09/09/23 150 lb 6.4 oz (  68.2 kg)  06/07/23 148 lb 3.2 oz (67.2 kg)     Physical Exam Constitutional:      Appearance: Normal appearance.  Cardiovascular:     Rate and Rhythm: Normal rate and regular rhythm.     Heart sounds: Normal heart sounds.  Pulmonary:     Effort: Pulmonary effort is normal.     Breath sounds: Normal breath sounds.  Musculoskeletal:     Right lower leg: No edema.     Left lower leg: No edema.  Neurological:     Mental Status: She is alert.       EKG: sinus bradycardia 54/min old ASWMI, no acute changes  Recent Labs: 05/06/2023: Hemoglobin 14.0; Platelets 328 06/07/2023: ALT 20; BUN 40; Creatinine, Ser 0.79; Potassium 4.9; Sodium 139    Lipid Panel    Component Value Date/Time   CHOL 229 (H) 06/07/2023 1416   TRIG 139 06/07/2023 1416   HDL 62 06/07/2023 1416   CHOLHDL 2.9 06/18/2020 0018   VLDL 9 06/18/2020 0018   LDLCALC 142 (H)  06/07/2023 1416      Other studies Reviewed: Additional studies/ records that were reviewed today include:  Review of the above records demonstrates:       No data to display            ASSESSMENT AND PLAN:    ICD-10-CM   1. Obstructive sleep apnea  G47.33 PCV ECHOCARDIOGRAM COMPLETE    MYOCARDIAL PERFUSION IMAGING    2. Mixed hyperlipidemia  E78.2 PCV ECHOCARDIOGRAM COMPLETE    MYOCARDIAL PERFUSION IMAGING    3. Essential hypertension, benign  I10 PCV ECHOCARDIOGRAM COMPLETE    MYOCARDIAL PERFUSION IMAGING    4. Other chest pain  R07.89 PCV ECHOCARDIOGRAM COMPLETE    MYOCARDIAL PERFUSION IMAGING   Had anginal type of chest pain,advise echo, stress test. EKG shows sinus bradycardia 54/min .       Problem List Items Addressed This Visit       Cardiovascular and Mediastinum   Essential hypertension, benign   Relevant Orders   PCV ECHOCARDIOGRAM COMPLETE   MYOCARDIAL PERFUSION IMAGING     Respiratory   Obstructive sleep apnea - Primary   Relevant Orders   PCV ECHOCARDIOGRAM COMPLETE   MYOCARDIAL PERFUSION IMAGING     Other   Hyperlipidemia   Relevant Orders   PCV ECHOCARDIOGRAM COMPLETE   MYOCARDIAL PERFUSION IMAGING   Other Visit Diagnoses       Other chest pain       Had anginal type of chest pain,advise echo, stress test. EKG shows sinus bradycardia 54/min .   Relevant Orders   PCV ECHOCARDIOGRAM COMPLETE   MYOCARDIAL PERFUSION IMAGING          Disposition:   Return in about 3 weeks (around 10/07/2023) for echo, stress test and f/u.    Total time spent: 50 minutes  Signed,  Debborah Fairly, MD  09/16/2023 2:03 PM    [facility]

## 2023-09-17 ENCOUNTER — Encounter: Payer: Self-pay | Admitting: Cardiovascular Disease

## 2023-09-17 LAB — CMP14+EGFR
ALT: 32 IU/L (ref 0–32)
AST: 27 IU/L (ref 0–40)
Albumin: 4.7 g/dL (ref 3.9–4.9)
Alkaline Phosphatase: 69 IU/L (ref 44–121)
BUN/Creatinine Ratio: 46 — ABNORMAL HIGH (ref 12–28)
BUN: 29 mg/dL — ABNORMAL HIGH (ref 8–27)
Bilirubin Total: 0.4 mg/dL (ref 0.0–1.2)
CO2: 25 mmol/L (ref 20–29)
Calcium: 10.3 mg/dL (ref 8.7–10.3)
Chloride: 107 mmol/L — ABNORMAL HIGH (ref 96–106)
Creatinine, Ser: 0.63 mg/dL (ref 0.57–1.00)
Globulin, Total: 1.9 g/dL (ref 1.5–4.5)
Glucose: 97 mg/dL (ref 70–99)
Potassium: 4.7 mmol/L (ref 3.5–5.2)
Sodium: 146 mmol/L — ABNORMAL HIGH (ref 134–144)
Total Protein: 6.6 g/dL (ref 6.0–8.5)
eGFR: 99 mL/min/{1.73_m2} (ref >59)

## 2023-09-17 LAB — LIPID PANEL W/O CHOL/HDL RATIO
Cholesterol, Total: 148 mg/dL (ref 100–199)
HDL: 53 mg/dL (ref >39)
LDL Chol Calc (NIH): 77 mg/dL (ref 0–99)
Triglycerides: 98 mg/dL (ref 0–149)
VLDL Cholesterol Cal: 18 mg/dL (ref 5–40)

## 2023-09-17 NOTE — Progress Notes (Signed)
 Patient notified

## 2023-09-26 ENCOUNTER — Encounter: Payer: Self-pay | Admitting: Physical Therapy

## 2023-09-26 ENCOUNTER — Other Ambulatory Visit: Payer: Self-pay

## 2023-09-26 ENCOUNTER — Encounter: Payer: 59 | Attending: Obstetrics | Admitting: Physical Therapy

## 2023-09-26 DIAGNOSIS — R278 Other lack of coordination: Secondary | ICD-10-CM | POA: Diagnosis present

## 2023-09-26 DIAGNOSIS — M6281 Muscle weakness (generalized): Secondary | ICD-10-CM | POA: Diagnosis present

## 2023-09-26 NOTE — Therapy (Signed)
 OUTPATIENT PHYSICAL THERAPY FEMALE PELVIC EVALUATION   Patient Name: Savannah Myers MRN: [id number] DOB:01-05-1961, 63 y.o., female Today's Date: 09/26/2023  END OF SESSION:  PT End of Session - 09/26/23 1555     Visit Number 1    Date for PT Re-Evaluation 03/28/24    Authorization Type [employer]    Authorization - Visit Number 1    Authorization - Number of Visits 30    PT Start Time 1550    PT Stop Time 1635    PT Time Calculation (min) 45 min    Activity Tolerance Patient tolerated treatment well    Behavior During Therapy WFL for tasks assessed/performed             Past Medical History:  Diagnosis Date   CHF (congestive heart failure) (HCC)    Depression    Hypertension    Urinary incontinence    Past Surgical History:  Procedure Laterality Date   ABDOMINAL HYSTERECTOMY     with RSO   LEFT HEART CATH AND CORONARY ANGIOGRAPHY N/A 06/20/2020   Procedure: LEFT HEART CATH AND CORONARY ANGIOGRAPHY;  Surgeon: Percival Brace, MD;  Location: [facility];  Service: Cardiovascular;  Laterality: N/A;   OVARY SURGERY     tumor removal   SALPINGOOPHORECTOMY Left 1986   benign ovarian mass   Patient Active Problem List   Diagnosis Date Noted   Vitamin D  deficiency 06/07/2023   Obstructive sleep apnea 05/06/2023   Gouty arthritis 05/06/2023   Urinary incontinence, mixed 03/22/2023   Feeling of incomplete bladder emptying 03/22/2023   Dyspareunia in female 03/22/2023   Pelvic organ prolapse quantification stage 2 rectocele 03/22/2023   Urethral caruncle 03/22/2023   Allergies 07/26/2022   Non-seasonal allergic rhinitis due to pollen 07/16/2022   GAD (generalized anxiety disorder) 07/16/2022   Diastolic dysfunction 06/20/2020   Demand ischemia (HCC) 06/18/2020   Enteritis 10/04/2018   Body mass index (BMI) of 32.0-32.9 in adult 10/29/2017   Varicose veins of both lower extremities with pain 03/06/2017   Hyperlipidemia 03/06/2017   Bilateral lower  extremity edema 03/06/2017   Pure hypercholesterolemia 02/11/2017   Onychomycosis of toenail 10/20/2016   Lumbar strain, initial encounter 10/19/2016   Postmenopausal 10/19/2016   Prediabetes 10/19/2016   Chronic insomnia 07/20/2016   Gastroesophageal reflux disease without esophagitis 07/20/2016   Frequent PVCs 08/22/2015   History of iron deficiency anemia 07/14/2015   Disequilibrium 03/07/2015   Essential hypertension, benign 02/28/2015   Intermittent palpitations 02/23/2015    PCP: Aisha Hove, MD  REFERRING PROVIDER: Darlene Ehlers, MD   REFERRING DIAG:  N81.6 (ICD-10-CM) - Pelvic organ prolapse quantification stage 2 rectocele  R39.14 (ICD-10-CM) - Feeling of incomplete bladder emptying  N39.46 (ICD-10-CM) - Urinary incontinence, mixed    THERAPY DIAG:  Muscle weakness (generalized)  Other lack of coordination  Rationale for Evaluation and Treatment: Rehabilitation  ONSET DATE: 2020  SUBJECTIVE:  SUBJECTIVE STATEMENT: interpreter present 62yo with stage II pelvic organ prolapse, mixed urinary incontinence Savannah Myers is a 63 y.o. female seen in follow-up for stage II pelvic organ prolapse, mixed urinary incontinence, feeling of incomplete bladder emptying, dyspareunia, and urethral caruncle.  Fluid intake:   PAIN:  Are you having pain? Yes NPRS scale: 5/10 Pain location: Vaginal  Pain type: tight Pain description: intermittent   Aggravating factors: vaginal penetration Relieving factors: no vaginal penetration  PRECAUTIONS: None  RED FLAGS: None   WEIGHT BEARING RESTRICTIONS: No  FALLS:  Has patient fallen in last 6 months? No  OCCUPATION: teacher  ACTIVITY LEVEL : no exercise  PLOF: Independent  PATIENT GOALS: reduce urinary leakage, pain with  intercourse, ways to manage her prolapse  PERTINENT HISTORY:  CHF (congestive heart failure) (HCC), Depression, Hypertension; Abdominal hysterectomy; LEFT HEART CATH AND CORONARY ANGIOGRAPHY (N/A, 06/20/2020); Ovary surgery; and Salpingoophorectomy (Left, 1986).   Sexual abuse: No  BOWEL MOVEMENT: Pain with bowel movement: No Fiber supplement/laxative Yes , stool softner  URINATION: Pain with urination: No Fully empty bladder: Yes: sometimesif have to rush urinating does not feel her bladder is empty Stream: Strong Urgency: No, she can wait to urinate Frequency: 4-5 hours Leakage: Coughing, Sneezing, Laughing, and when she waits for a long time Pads: Yes: thin pad 2 per day  INTERCOURSE:  Ability to have vaginal penetration Yes  Pain with intercourse: Initial Penetration DrynessNo Marinoff Scale: 1/3 No lubricant  PREGNANCY: Vaginal deliveries 3 Tearing Yes: big tear  PROLAPSE: Pressure and Bulge   OBJECTIVE:  Note: Objective measures were completed at Evaluation unless otherwise noted.  DIAGNOSTIC FINDINGS:  none   COGNITION: Overall cognitive status: Within functional limits for tasks assessed     SENSATION: Light touch: Appears intact   FUNCTIONAL TESTS:  Able to squat and stand on one leg without difficulty   POSTURE: rounded shoulders, forward head, and decreased lumbar lordosis   LUMBARAROM/PROM: full lumbar ROM   LOWER EXTREMITY ROM:  Passive ROM Right eval Left eval  Hip external rotation 50 50   (Blank rows = not tested)  LOWER EXTREMITY MMT:  MMT Right eval Left eval  Hip extension 4/5 4/5  Hip abduction 4/5 4/5   (Blank rows = not tested) PALPATION:   Abdominal: not able to expand the lower rib cage; bulges the abdomen when lifting head up, scar has restrictions                External Perineal Exam: scar on perineal body that had decreased mobility                             Internal Pelvic Floor: tenderness with gloved  finger was on the left levator ani, no movement of the left side of the introitus  Patient confirms identification and approves PT to assess internal pelvic floor and treatment Yes No emotional/communication barriers or cognitive limitation. Patient is motivated to learn. Patient understands and agrees with treatment goals and plan. PT explains patient will be examined in standing, sitting, and lying down to see how their muscles and joints work. When they are ready, they will be asked to remove their underwear so PT can examine their perineum. The patient is also given the option of providing their own chaperone as one is not provided in our facility. The patient also has the right and is explained the right to defer or refuse any part of the  evaluation or treatment including the internal exam. With the patient's consent, PT will use one gloved finger to gently assess the muscles of the pelvic floor, seeing how well it contracts and relaxes and if there is muscle symmetry. After, the patient will get dressed and PT and patient will discuss exam findings and plan of care. PT and patient discuss plan of care, schedule, attendance policy and HEP activities.   PELVIC MMT:   MMT eval  Vaginal Initially vaginal strength was 1/5 except on left side was 0/5; after manual work to the introitus pelvic floor strength increased to 3/5 with hug of therapist finger  Diastasis Recti 1/2 finger width below umbilicus and 1 finger width above umbilicus  (Blank rows = not tested)        TONE: Average pelvic floor tone   PROLAPSE: Posterior wall weakness coming to  the introitus  TODAY'S TREATMENT:                                                                                                                              DATE: 09/26/23  EVAL Examination completed, findings reviewed, pt educated on POC, HEP, and female pelvic floor anatomy, reasoning with pelvic floor assessment internally with pt consent. Pt  motivated to participate in PT and agreeable to attempt recommendations.     PATIENT EDUCATION:  09/26/23 Education details: Access Code: [email or web address] Person educated: Patient Education method: Explanation, Demonstration, Tactile cues, Verbal cues, and Handouts Education comprehension: verbalized understanding, returned demonstration, verbal cues required, tactile cues required, and needs further education  HOME EXERCISE PROGRAM: 09/26/23 Access Code: [email or web address] URL: [email or web address] Date: 09/26/2023 Prepared by: Marsha Skeen  Exercises - Seated Pelvic Floor Contraction  - 3 x daily - 7 x weekly - 1 sets - 10 reps - 5 sec hold - Seated Quick Flick Pelvic Floor Contractions  - 3 x daily - 7 x weekly - 1 sets - 5 reps  ASSESSMENT:  CLINICAL IMPRESSION: Patient is a 63 y.o. female who was seen today for physical therapy evaluation and treatment for mixed incontinence, rectocele stage 2, incomplete emptying of bladder. Patient reports 5/10 pain with initial penetration of the penis. She does not always empty her bladder fully when she has to rush to urinate due to not fully relaxing the pelvic floor. She will leak with a full bladder when she coughing, sneezing, and laughing. Her abdominal scar has restrictions. She will bulge her abdomen when she tries to contract them. Pelvic floor strength was 1/5 except for the left side was 0/5. After manual work her strength of the pelvic floor increased to 3/5 with hug of therapist finger. She has tenderness located on the left levator ani. She has posterior wall weakness that comes to the introitus. Patient will benefit from skilled therapy to reduce pain with penile penetration, improve bladder emptying, reduce urinary leakage and management of prolapse.   OBJECTIVE IMPAIRMENTS: decreased coordination, decreased endurance, decreased strength, increased fascial restrictions,  and pain.   ACTIVITY LIMITATIONS: continence and  toileting  PARTICIPATION LIMITATIONS: interpersonal relationship  PERSONAL FACTORS: 1-2 comorbidities: CHF (congestive heart failure) (HCC), Depression, Hypertension; Abdominal hysterectomy; LEFT HEART CATH AND CORONARY ANGIOGRAPHY (N/A, 06/20/2020); Ovary surgery; and Salpingoophorectomy (Left, 1986).  are also affecting patient's functional outcome.   REHAB POTENTIAL: Excellent  CLINICAL DECISION MAKING: Evolving/moderate complexity  EVALUATION COMPLEXITY: Moderate   GOALS: Goals reviewed with patient? Yes  SHORT TERM GOALS: Target date: 10/24/23  Patient independent with initial HEP.  Baseline: Goal status: INITIAL   LONG TERM GOALS: Target date: 03/28/24  Patient independent with advanced HEP for core and pelvic floor strength to reduce leakage.  Baseline:  Goal status: INITIAL  2.  Patient is able to cough and sneeze with a full bladder without leaking urine due to pelvic floor strength >/= 4/5.  Baseline:  Goal status: INITIAL  3.  Patient is able to sit on the commode and fully relax her pelvic floor to fully empty her bladder 80% of the time.  Baseline:  Goal status: INITIAL  4.  Patient is able to report pain level with vaginal penetration </= 0-1/10 due to improve mobility of the pelvic floor.  Baseline:  Goal status: INITIAL    PLAN:  PT FREQUENCY: 1x/week  PT DURATION: 6 months  PLANNED INTERVENTIONS: 97110-Therapeutic exercises, 97530- Therapeutic activity, 97112- Neuromuscular re-education, 97535- Self Care, [id number]- Manual therapy, Patient/Family education, Dry Needling, Joint mobilization, and Biofeedback  PLAN FOR NEXT SESSION: diaphragmatic breathing, opening the rib cage, engagement of the abdominals, how to sit on commode and fully empty the bladder   Marsha Skeen, PT 09/26/23 4:55 PM

## 2023-09-29 ENCOUNTER — Other Ambulatory Visit: Payer: Self-pay | Admitting: Cardiovascular Disease

## 2023-09-29 DIAGNOSIS — E782 Mixed hyperlipidemia: Secondary | ICD-10-CM

## 2023-10-03 ENCOUNTER — Encounter: Payer: Self-pay | Admitting: Physical Therapy

## 2023-10-03 ENCOUNTER — Encounter: Payer: 59 | Admitting: Physical Therapy

## 2023-10-03 DIAGNOSIS — M6281 Muscle weakness (generalized): Secondary | ICD-10-CM

## 2023-10-03 DIAGNOSIS — R278 Other lack of coordination: Secondary | ICD-10-CM

## 2023-10-03 NOTE — Therapy (Signed)
 OUTPATIENT PHYSICAL THERAPY FEMALE PELVIC TREATMENT   Patient Name: Savannah Myers MRN: [id number] DOB:1961-04-19, 63 y.o., female Today's Date: 10/03/2023  END OF SESSION:  PT End of Session - 10/03/23 1604     Visit Number 2    Date for PT Re-Evaluation 03/28/24    Authorization Type [employer]    Authorization - Visit Number 2    Authorization - Number of Visits 30    PT Start Time 1600    PT Stop Time 1645    PT Time Calculation (min) 45 min    Activity Tolerance Patient tolerated treatment well    Behavior During Therapy WFL for tasks assessed/performed             Past Medical History:  Diagnosis Date   CHF (congestive heart failure) (HCC)    Depression    Hypertension    Urinary incontinence    Past Surgical History:  Procedure Laterality Date   ABDOMINAL HYSTERECTOMY     with RSO   LEFT HEART CATH AND CORONARY ANGIOGRAPHY N/A 06/20/2020   Procedure: LEFT HEART CATH AND CORONARY ANGIOGRAPHY;  Surgeon: Percival Brace, MD;  Location: [facility];  Service: Cardiovascular;  Laterality: N/A;   OVARY SURGERY     tumor removal   SALPINGOOPHORECTOMY Left 1986   benign ovarian mass   Patient Active Problem List   Diagnosis Date Noted   Vitamin D  deficiency 06/07/2023   Obstructive sleep apnea 05/06/2023   Gouty arthritis 05/06/2023   Urinary incontinence, mixed 03/22/2023   Feeling of incomplete bladder emptying 03/22/2023   Dyspareunia in female 03/22/2023   Pelvic organ prolapse quantification stage 2 rectocele 03/22/2023   Urethral caruncle 03/22/2023   Allergies 07/26/2022   Non-seasonal allergic rhinitis due to pollen 07/16/2022   GAD (generalized anxiety disorder) 07/16/2022   Diastolic dysfunction 06/20/2020   Demand ischemia (HCC) 06/18/2020   Enteritis 10/04/2018   Body mass index (BMI) of 32.0-32.9 in adult 10/29/2017   Varicose veins of both lower extremities with pain 03/06/2017   Hyperlipidemia 03/06/2017   Bilateral lower  extremity edema 03/06/2017   Pure hypercholesterolemia 02/11/2017   Onychomycosis of toenail 10/20/2016   Lumbar strain, initial encounter 10/19/2016   Postmenopausal 10/19/2016   Prediabetes 10/19/2016   Chronic insomnia 07/20/2016   Gastroesophageal reflux disease without esophagitis 07/20/2016   Frequent PVCs 08/22/2015   History of iron deficiency anemia 07/14/2015   Disequilibrium 03/07/2015   Essential hypertension, benign 02/28/2015   Intermittent palpitations 02/23/2015    PCP: Aisha Hove, MD  REFERRING PROVIDER: Darlene Ehlers, MD   REFERRING DIAG:  N81.6 (ICD-10-CM) - Pelvic organ prolapse quantification stage 2 rectocele  R39.14 (ICD-10-CM) - Feeling of incomplete bladder emptying  N39.46 (ICD-10-CM) - Urinary incontinence, mixed    THERAPY DIAG:  Muscle weakness (generalized)  Other lack of coordination  Rationale for Evaluation and Treatment: Rehabilitation  ONSET DATE: 2020  SUBJECTIVE:  SUBJECTIVE STATEMENT:  I can feel the pelvic floor contract. Patient chose not to have an interpreter and she let the interpreter know.    PAIN:  Are you having pain? Yes NPRS scale: 5/10 Pain location: Vaginal  Pain type: tight Pain description: intermittent   Aggravating factors: vaginal penetration Relieving factors: no vaginal penetration  PRECAUTIONS: None  RED FLAGS: None   WEIGHT BEARING RESTRICTIONS: No  FALLS:  Has patient fallen in last 6 months? No  OCCUPATION: teacher  ACTIVITY LEVEL : no exercise  PLOF: Independent  PATIENT GOALS: reduce urinary leakage, pain with intercourse, ways to manage her prolapse  PERTINENT HISTORY:  CHF (congestive heart failure) (HCC), Depression, Hypertension; Abdominal hysterectomy; LEFT HEART CATH AND CORONARY ANGIOGRAPHY  (N/A, 06/20/2020); Ovary surgery; and Salpingoophorectomy (Left, 1986).   Sexual abuse: No  BOWEL MOVEMENT: Pain with bowel movement: No Fiber supplement/laxative Yes , stool softner  URINATION: Pain with urination: No Fully empty bladder: Yes: sometimesif have to rush urinating does not feel her bladder is empty Stream: Strong Urgency: No, she can wait to urinate Frequency: 4-5 hours Leakage: Coughing, Sneezing, Laughing, and when she waits for a long time Pads: Yes: thin pad 2 per day  INTERCOURSE:  Ability to have vaginal penetration Yes  Pain with intercourse: Initial Penetration DrynessNo Marinoff Scale: 1/3 No lubricant  PREGNANCY: Vaginal deliveries 3 Tearing Yes: big tear  PROLAPSE: Pressure and Bulge   OBJECTIVE:  Note: Objective measures were completed at Evaluation unless otherwise noted.  DIAGNOSTIC FINDINGS:  none   COGNITION: Overall cognitive status: Within functional limits for tasks assessed     SENSATION: Light touch: Appears intact   FUNCTIONAL TESTS:  Able to squat and stand on one leg without difficulty   POSTURE: rounded shoulders, forward head, and decreased lumbar lordosis   LUMBARAROM/PROM: full lumbar ROM   LOWER EXTREMITY ROM:  Passive ROM Right eval Left eval  Hip external rotation 50 50   (Blank rows = not tested)  LOWER EXTREMITY MMT:  MMT Right eval Left eval  Hip extension 4/5 4/5  Hip abduction 4/5 4/5   (Blank rows = not tested) PALPATION:   Abdominal: not able to expand the lower rib cage; bulges the abdomen when lifting head up, scar has restrictions                External Perineal Exam: scar on perineal body that had decreased mobility                             Internal Pelvic Floor: tenderness with gloved finger was on the left levator ani, no movement of the left side of the introitus  Patient confirms identification and approves PT to assess internal pelvic floor and treatment Yes No  emotional/communication barriers or cognitive limitation. Patient is motivated to learn. Patient understands and agrees with treatment goals and plan. PT explains patient will be examined in standing, sitting, and lying down to see how their muscles and joints work. When they are ready, they will be asked to remove their underwear so PT can examine their perineum. The patient is also given the option of providing their own chaperone as one is not provided in our facility. The patient also has the right and is explained the right to defer or refuse any part of the evaluation or treatment including the internal exam. With the patient's consent, PT will use one gloved finger to gently assess the muscles of  the pelvic floor, seeing how well it contracts and relaxes and if there is muscle symmetry. After, the patient will get dressed and PT and patient will discuss exam findings and plan of care. PT and patient discuss plan of care, schedule, attendance policy and HEP activities.   PELVIC MMT:   MMT eval  Vaginal Initially vaginal strength was 1/5 except on left side was 0/5; after manual work to the introitus pelvic floor strength increased to 3/5 with hug of therapist finger  Diastasis Recti 1/2 finger width below umbilicus and 1 finger width above umbilicus  (Blank rows = not tested)        TONE: Average pelvic floor tone   PROLAPSE: Posterior wall weakness coming to  the introitus  TODAY'S TREATMENT:     10/03/23 Neuromuscular re-education: Pelvic floor contraction training: Transverse abdominus contraction with breathing and tactile cues to not hinge at the TL junction  Supine marching with abdominal contraction 15 x Down training: Diaphragmatic breathing to relax the pelvic floor 10 x in supine then in sitting to prepare for relaxation on the commode Therapeutic activities: Functional strengthening activities: Educated patient on sitting on commode to relax her pelvic floor and breath out  to push the urine out.  Self-care: Educated patient on using lubricants with intercourse to reduce pain and friction Educated patient on how to massage the introitus to elongate the tissue to reduce pain with intercourse                                                                                                                             DATE: 09/26/23  EVAL Examination completed, findings reviewed, pt educated on POC, HEP, and female pelvic floor anatomy, reasoning with pelvic floor assessment internally with pt consent. Pt motivated to participate in PT and agreeable to attempt recommendations.     PATIENT EDUCATION:  10/03/23 Education details: Access Code: [email or web address] Person educated: Patient Education method: Explanation, Demonstration, Tactile cues, Verbal cues, and Handouts Education comprehension: verbalized understanding, returned demonstration, verbal cues required, tactile cues required, and needs further education  HOME EXERCISE PROGRAM: 10/03/23 Access Code: [email or web address] URL: [email or web address] Date: 10/03/2023 Prepared by: Marsha Skeen  Exercises - Seated Pelvic Floor Contraction  - 3 x daily - 7 x weekly - 1 sets - 10 reps - 5 sec hold - Seated Quick Flick Pelvic Floor Contractions  - 3 x daily - 7 x weekly - 1 sets - 5 reps - Supine Diaphragmatic Breathing  - 1 x daily - 7 x weekly - 1 sets - 10 reps - Seated Diaphragmatic Breathing  - 1 x daily - 7 x weekly - 1 sets - 10 reps - Supine March  - 1 x daily - 7 x weekly - 1 sets - 10 reps  ASSESSMENT:  CLINICAL IMPRESSION: Patient is a 63 y.o. female who was seen today for physical therapy  treatment for mixed incontinence, rectocele stage 2, incomplete emptying of bladder. Patient is  able to feel the pelvic floor relax with diaphragmatic breathing. She is able to contract the pelvic floor and feel the lift compared to before she was not. She understands how to mobilize the vaginal tissue to reduce pain with  penetration.  Patient will benefit from skilled therapy to reduce pain with penile penetration, improve bladder emptying, reduce urinary leakage and management of prolapse.   OBJECTIVE IMPAIRMENTS: decreased coordination, decreased endurance, decreased strength, increased fascial restrictions, and pain.   ACTIVITY LIMITATIONS: continence and toileting  PARTICIPATION LIMITATIONS: interpersonal relationship  PERSONAL FACTORS: 1-2 comorbidities: CHF (congestive heart failure) (HCC), Depression, Hypertension; Abdominal hysterectomy; LEFT HEART CATH AND CORONARY ANGIOGRAPHY (N/A, 06/20/2020); Ovary surgery; and Salpingoophorectomy (Left, 1986).  are also affecting patient's functional outcome.   REHAB POTENTIAL: Excellent  CLINICAL DECISION MAKING: Evolving/moderate complexity  EVALUATION COMPLEXITY: Moderate   GOALS: Goals reviewed with patient? Yes  SHORT TERM GOALS: Target date: 10/24/23  Patient independent with initial HEP.  Baseline: Goal status: INITIAL   LONG TERM GOALS: Target date: 03/28/24  Patient independent with advanced HEP for core and pelvic floor strength to reduce leakage.  Baseline:  Goal status: INITIAL  2.  Patient is able to cough and sneeze with a full bladder without leaking urine due to pelvic floor strength >/= 4/5.  Baseline:  Goal status: INITIAL  3.  Patient is able to sit on the commode and fully relax her pelvic floor to fully empty her bladder 80% of the time.  Baseline:  Goal status: INITIAL  4.  Patient is able to report pain level with vaginal penetration </= 0-1/10 due to improve mobility of the pelvic floor.  Baseline:  Goal status: INITIAL    PLAN:  PT FREQUENCY: 1x/week  PT DURATION: 6 months  PLANNED INTERVENTIONS: 97110-Therapeutic exercises, 97530- Therapeutic activity, 97112- Neuromuscular re-education, 97535- Self Care, 16109- Manual therapy, Patient/Family education, Dry Needling, Joint mobilization, and Biofeedback  PLAN  FOR NEXT SESSION: engagement of the abdominals; knack, vaginal moisturizers   Marsha Skeen, PT 10/03/23 4:45 PM

## 2023-10-03 NOTE — Patient Instructions (Signed)
  Lubrication Used for intercourse to reduce friction Avoid ones that have glycerin, nonoxynol-9, petroleum, propylene glycol, chlorhexidine gluconate, warming gels, tingling gels, icing or cooling gel, scented Avoid parabens due to a preservative similar to female sex hormone May need to be reapplied once or several times during sexual activity Can be applied to both partners genitals prior to vaginal penetration to minimize friction or irritation Prevent irritation and mucosal tears that cause post coital pain and increased the risk of vaginal and urinary tract infections Oil-based lubricants cannot be used with condoms due to breaking them down.  Least likely to irritate vaginal tissue.  Plant based-lubes are safe Silicone-based lubrication are thicker and last long and used for post-menopausal women  Vaginal Lubricators Here is a list of some suggested lubricators you can use for intercourse. Use the most hypoallergenic product.  You can place on you or your partner.  Slippery Stuff ( water based) Sylk or Sliquid Natural H2O ( good  if frequent UTI's)- [employer], amazon Sliquid organics silk-(aloe and silicone based ) Blossom Organics (www.blossom-organics.com)- (aloe based ) PJur Woman Nude- (water based) Sherree Doctor- ( silicon) Amazon Aloe Vera- Sprouts has an organic one Yes lubricant- (water based and has plant oil based similar to silicone) [employer] Platinum-Silicone, [employer], [facility] Olive and Bee intimate cream-  www.oliveandbee.com.au Pink - [employer] Erosense Sync- [employer], amazon Coconu- coconu.com Desert [employer] Good Clean Love lubricants  Things to avoid in lubricants are glycerin, warming gels, tingling gels, icing or cooling  gels, and scented gels.  Also avoid Vaseline. KY jelly,  and Astroglide contain chlorhexidine which kills good bacteria(lactobacilli)  Things to avoid in the vaginal area Do not use things to irritate the vulvar area No lotions-  see below Soaps you  can use :Aveeno, [place], Good Clean Love cleanser if needed. Must be gentle No deodorants No douches Good to sleep without underwear to let the vaginal area to air out No scrubbing: spread the lips to let warm water rinse over labias and pat dry  Creams that can be used on the Vulva Area V [employer], [employer] Vital V Wild Yam Salve Julva- Amazon MoonMaid Botanical Pro-Meno Wild Yam Cream Coconut oil, olive oil Cleo by [employer] labial moisturizer -[employer],  Desert [employer] Releveum ( lidocaine ) or Desert Grand Blanc Yes Moisturizer      Marsha Skeen, PT [facility] [address] [place], [place] [address] W: [phone] [email or web address]

## 2023-10-10 ENCOUNTER — Encounter: Payer: 59 | Admitting: Physical Therapy

## 2023-10-17 ENCOUNTER — Encounter: Payer: 59 | Admitting: Physical Therapy

## 2023-10-24 ENCOUNTER — Encounter: Payer: Self-pay | Admitting: Physical Therapy

## 2023-10-24 ENCOUNTER — Encounter: Attending: Obstetrics | Admitting: Physical Therapy

## 2023-10-24 DIAGNOSIS — M6281 Muscle weakness (generalized): Secondary | ICD-10-CM | POA: Insufficient documentation

## 2023-10-24 DIAGNOSIS — R278 Other lack of coordination: Secondary | ICD-10-CM | POA: Diagnosis present

## 2023-10-24 NOTE — Therapy (Addendum)
 OUTPATIENT PHYSICAL THERAPY FEMALE PELVIC TREATMENT   Patient Name: Savannah Myers MRN: [id number] DOB:09/16/1960, 63 y.o., female Today's Date: 10/24/2023  END OF SESSION:  PT End of Session - 10/24/23 0840     Visit Number 3    Date for PT Re-Evaluation 03/28/24    Authorization Type [employer]    Authorization - Visit Number 3    Authorization - Number of Visits 30    PT Start Time 0830    PT Stop Time 0915    PT Time Calculation (min) 45 min    Activity Tolerance Patient tolerated treatment well    Behavior During Therapy WFL for tasks assessed/performed          Past Medical History:  Diagnosis Date   CHF (congestive heart failure) (HCC)    Depression    Hypertension    Urinary incontinence    Past Surgical History:  Procedure Laterality Date   ABDOMINAL HYSTERECTOMY     with RSO   LEFT HEART CATH AND CORONARY ANGIOGRAPHY N/A 06/20/2020   Procedure: LEFT HEART CATH AND CORONARY ANGIOGRAPHY;  Surgeon: Ammon Blunt, MD;  Location: [facility];  Service: Cardiovascular;  Laterality: N/A;   OVARY SURGERY     tumor removal   SALPINGOOPHORECTOMY Left 1986   benign ovarian mass   Patient Active Problem List   Diagnosis Date Noted   Vitamin D  deficiency 06/07/2023   Obstructive sleep apnea 05/06/2023   Gouty arthritis 05/06/2023   Urinary incontinence, mixed 03/22/2023   Feeling of incomplete bladder emptying 03/22/2023   Dyspareunia in female 03/22/2023   Pelvic organ prolapse quantification stage 2 rectocele 03/22/2023   Urethral caruncle 03/22/2023   Allergies 07/26/2022   Non-seasonal allergic rhinitis due to pollen 07/16/2022   GAD (generalized anxiety disorder) 07/16/2022   Diastolic dysfunction 06/20/2020   Demand ischemia (HCC) 06/18/2020   Enteritis 10/04/2018   Body mass index (BMI) of 32.0-32.9 in adult 10/29/2017   Varicose veins of both lower extremities with pain 03/06/2017   Hyperlipidemia 03/06/2017   Bilateral lower extremity  edema 03/06/2017   Pure hypercholesterolemia 02/11/2017   Onychomycosis of toenail 10/20/2016   Lumbar strain, initial encounter 10/19/2016   Postmenopausal 10/19/2016   Prediabetes 10/19/2016   Chronic insomnia 07/20/2016   Gastroesophageal reflux disease without esophagitis 07/20/2016   Frequent PVCs 08/22/2015   History of iron deficiency anemia 07/14/2015   Disequilibrium 03/07/2015   Essential hypertension, benign 02/28/2015   Intermittent palpitations 02/23/2015    PCP: Fernand Fredy RAMAN, MD  REFERRING PROVIDER: Guadlupe Lianne DASEN, MD   REFERRING DIAG:  N81.6 (ICD-10-CM) - Pelvic organ prolapse quantification stage 2 rectocele  R39.14 (ICD-10-CM) - Feeling of incomplete bladder emptying  N39.46 (ICD-10-CM) - Urinary incontinence, mixed    THERAPY DIAG:  Muscle weakness (generalized)  Other lack of coordination  Rationale for Evaluation and Treatment: Rehabilitation  ONSET DATE: 2020  SUBJECTIVE:  Interpreter present  SUBJECTIVE STATEMENT:  This week I had an incidence that I could not make it to the bathroom and peed on myself. I have not had intercourse since last visit. When bladder is full I could leak urine.   PAIN:  Are you having pain? Yes NPRS scale: 5/10 Pain location: Vaginal  Pain type: tight Pain description: intermittent   Aggravating factors: vaginal penetration Relieving factors: no vaginal penetration  PRECAUTIONS: None  RED FLAGS: None   WEIGHT BEARING RESTRICTIONS: No  FALLS:  Has patient fallen in last 6 months? No  OCCUPATION: teacher  ACTIVITY LEVEL : no exercise  PLOF: Independent  PATIENT GOALS: reduce urinary leakage, pain with intercourse, ways to manage her prolapse  PERTINENT HISTORY:  CHF (congestive heart failure) (HCC), Depression,  Hypertension; Abdominal hysterectomy; LEFT HEART CATH AND CORONARY ANGIOGRAPHY (N/A, 06/20/2020); Ovary surgery; and Salpingoophorectomy (Left, 1986).   Sexual abuse: No  BOWEL MOVEMENT: Pain with bowel movement: No Fiber supplement/laxative Yes , stool softner  URINATION: Pain with urination: No Fully empty bladder: Yes: sometimesif have to rush urinating does not feel her bladder is empty Stream: Strong Urgency: No, she can wait to urinate Frequency: 4-5 hours Leakage: Coughing, Sneezing, Laughing, and when she waits for a long time Pads: Yes: thin pad 2 per day  INTERCOURSE:  Ability to have vaginal penetration Yes  Pain with intercourse: Initial Penetration DrynessNo Marinoff Scale: 1/3 No lubricant  PREGNANCY: Vaginal deliveries 3 Tearing Yes: big tear  PROLAPSE: Pressure and Bulge   OBJECTIVE:  Note: Objective measures were completed at Evaluation unless otherwise noted.  DIAGNOSTIC FINDINGS:  none   COGNITION: Overall cognitive status: Within functional limits for tasks assessed     SENSATION: Light touch: Appears intact   FUNCTIONAL TESTS:  Able to squat and stand on one leg without difficulty   POSTURE: rounded shoulders, forward head, and decreased lumbar lordosis   LUMBARAROM/PROM: full lumbar ROM   LOWER EXTREMITY ROM:  Passive ROM Right eval Left eval  Hip external rotation 50 50   (Blank rows = not tested)  LOWER EXTREMITY MMT:  MMT Right eval Left eval  Hip extension 4/5 4/5  Hip abduction 4/5 4/5   (Blank rows = not tested) PALPATION:   Abdominal: not able to expand the lower rib cage; bulges the abdomen when lifting head up, scar has restrictions                External Perineal Exam: scar on perineal body that had decreased mobility                             Internal Pelvic Floor: tenderness with gloved finger was on the left levator ani, no movement of the left side of the introitus  Patient confirms identification  and approves PT to assess internal pelvic floor and treatment Yes No emotional/communication barriers or cognitive limitation. Patient is motivated to learn. Patient understands and agrees with treatment goals and plan. PT explains patient will be examined in standing, sitting, and lying down to see how their muscles and joints work. When they are ready, they will be asked to remove their underwear so PT can examine their perineum. The patient is also given the option of providing their own chaperone as one is not provided in our facility. The patient also has the right and is explained the right to defer or refuse any part of the evaluation or treatment including the internal exam. With the  patient's consent, PT will use one gloved finger to gently assess the muscles of the pelvic floor, seeing how well it contracts and relaxes and if there is muscle symmetry. After, the patient will get dressed and PT and patient will discuss exam findings and plan of care. PT and patient discuss plan of care, schedule, attendance policy and HEP activities.   PELVIC MMT:   MMT eval  Vaginal Initially vaginal strength was 1/5 except on left side was 0/5; after manual work to the introitus pelvic floor strength increased to 3/5 with hug of therapist finger  Diastasis Recti 1/2 finger width below umbilicus and 1 finger width above umbilicus  (Blank rows = not tested)        TONE: Average pelvic floor tone   PROLAPSE: Posterior wall weakness coming to  the introitus  TODAY'S TREATMENT:     10/24/23 Neuromuscular re-education: Core facilitation: Transverse abdominus contraction with tactile cues to engage the lower abdomen and pelvic floor and squeeze the yoga block\ Supine alternate shoulder and hip flexion with red band around knees, holding yellow band, pelvic floor and core engaged Bridge with holding yellow band in hands, and hip abduction with red band Quadruped lift alternate extremity with core and  pelvic floor contracted Self-care: Educated patient on how to massage the introitus to elongate the tissue to reduce pain with intercourse Reviewed about the moisturizers and lubricants for vaginal health    10/03/23 Neuromuscular re-education: Pelvic floor contraction training: Transverse abdominus contraction with breathing and tactile cues to not hinge at the TL junction  Supine marching with abdominal contraction 15 x Down training: Diaphragmatic breathing to relax the pelvic floor 10 x in supine then in sitting to prepare for relaxation on the commode Therapeutic activities: Functional strengthening activities: Educated patient on sitting on commode to relax her pelvic floor and breath out to push the urine out.  Self-care: Educated patient on using lubricants with intercourse to reduce pain and friction Educated patient on how to massage the introitus to elongate the tissue to reduce pain with intercourse        PATIENT EDUCATION:  10/24/23 Education details: Access Code: [email or web address] Person educated: Patient Education method: Explanation, Demonstration, Tactile cues, Verbal cues, and Handouts Education comprehension: verbalized understanding, returned demonstration, verbal cues required, tactile cues required, and needs further education  HOME EXERCISE PROGRAM: 10/24/23 Access Code: [email or web address] URL: [email or web address] Date: 10/24/2023 Prepared by: Channing Pereyra  Exercises - Seated Pelvic Floor Contraction  - 3 x daily - 7 x weekly - 1 sets - 10 reps - 5 sec hold - Seated Quick Flick Pelvic Floor Contractions  - 3 x daily - 7 x weekly - 1 sets - 5 reps - Supine Diaphragmatic Breathing  - 1 x daily - 7 x weekly - 1 sets - 10 reps - Seated Diaphragmatic Breathing  - 1 x daily - 7 x weekly - 1 sets - 10 reps - Supine March  - 1 x daily - 7 x weekly - 1 sets - 10 reps - Hooklying Transversus Abdominis Palpation  - 1 x daily - 3 x weekly - 1 sets - 10 reps - Dead Bug   - 1 x daily - 3 x weekly - 2 sets - 10 reps - Bridge with Hip Abduction and Resistance  - 1 x daily - 3 x weekly - 2 sets - 10 reps - Quadruped Pelvic Floor Contraction with Opposite Arm and Leg Lift  - 1 x daily - 3 x weekly -  2 sets - 10 reps  ASSESSMENT:  CLINICAL IMPRESSION: Patient is a 63 y.o. female who was seen today for physical therapy  treatment for mixed incontinence, rectocele stage 2, incomplete emptying of bladder. Patient is now able to fully empty her bladder with the techniques she has learned.  Patient needs cues to  engage her core and pelvic floor. Patient has had one instance with urinary leakage while going to the bathroom. Patient will benefit from skilled therapy to reduce pain with penile penetration, improve bladder emptying, reduce urinary leakage and management of prolapse.   OBJECTIVE IMPAIRMENTS: decreased coordination, decreased endurance, decreased strength, increased fascial restrictions, and pain.   ACTIVITY LIMITATIONS: continence and toileting  PARTICIPATION LIMITATIONS: interpersonal relationship  PERSONAL FACTORS: 1-2 comorbidities: CHF (congestive heart failure) (HCC), Depression, Hypertension; Abdominal hysterectomy; LEFT HEART CATH AND CORONARY ANGIOGRAPHY (N/A, 06/20/2020); Ovary surgery; and Salpingoophorectomy (Left, 1986).  are also affecting patient's functional outcome.   REHAB POTENTIAL: Excellent  CLINICAL DECISION MAKING: Evolving/moderate complexity  EVALUATION COMPLEXITY: Moderate   GOALS: Goals reviewed with patient? Yes  SHORT TERM GOALS: Target date: 10/24/23  Patient independent with initial HEP.  Baseline: Goal status: Met 10/24/23   LONG TERM GOALS: Target date: 03/28/24  Patient independent with advanced HEP for core and pelvic floor strength to reduce leakage.  Baseline:  Goal status: INITIAL  2.  Patient is able to cough and sneeze with a full bladder without leaking urine due to pelvic floor strength >/= 4/5.   Baseline:  Goal status: INITIAL  3.  Patient is able to sit on the commode and fully relax her pelvic floor to fully empty her bladder 80% of the time.  Baseline:  Goal status: INITIAL  4.  Patient is able to report pain level with vaginal penetration </= 0-1/10 due to improve mobility of the pelvic floor.  Baseline:  Goal status: INITIAL    PLAN:  PT FREQUENCY: 1x/week  PT DURATION: 6 months  PLANNED INTERVENTIONS: 97110-Therapeutic exercises, 97530- Therapeutic activity, 97112- Neuromuscular re-education, 97535- Self Care, [id number]- Manual therapy, Patient/Family education, Dry Needling, Joint mobilization, and Biofeedback  PLAN FOR NEXT SESSION: engagement of the abdominals; knack   Channing Pereyra, PT 10/24/23 9:30 AM  PHYSICAL THERAPY DISCHARGE SUMMARY  Visits from Start of Care: 3  Current functional level related to goals / functional outcomes: See above. Patient has not returned since her last visit on 10/24/23.   Remaining deficits: See above.    Education / Equipment: HEP   Patient agrees to discharge. Patient goals were not met. Patient is being discharged due to not returning since the last visit. Thank you for the referral.   Channing Pereyra, PT 03/31/24 2:07 PM

## 2023-11-01 ENCOUNTER — Other Ambulatory Visit

## 2023-11-06 ENCOUNTER — Ambulatory Visit (INDEPENDENT_AMBULATORY_CARE_PROVIDER_SITE_OTHER)

## 2023-11-06 DIAGNOSIS — I361 Nonrheumatic tricuspid (valve) insufficiency: Secondary | ICD-10-CM

## 2023-11-06 DIAGNOSIS — R0789 Other chest pain: Secondary | ICD-10-CM

## 2023-11-06 DIAGNOSIS — I340 Nonrheumatic mitral (valve) insufficiency: Secondary | ICD-10-CM

## 2023-11-06 DIAGNOSIS — E782 Mixed hyperlipidemia: Secondary | ICD-10-CM

## 2023-11-06 DIAGNOSIS — G4733 Obstructive sleep apnea (adult) (pediatric): Secondary | ICD-10-CM

## 2023-11-06 DIAGNOSIS — I10 Essential (primary) hypertension: Secondary | ICD-10-CM

## 2023-11-11 ENCOUNTER — Encounter: Payer: Self-pay | Admitting: Cardiovascular Disease

## 2023-11-11 ENCOUNTER — Ambulatory Visit: Admitting: Cardiovascular Disease

## 2023-11-11 VITALS — BP 121/81 | HR 69 | Ht 64.0 in | Wt 151.4 lb

## 2023-11-11 DIAGNOSIS — I10 Essential (primary) hypertension: Secondary | ICD-10-CM | POA: Diagnosis not present

## 2023-11-11 DIAGNOSIS — R0789 Other chest pain: Secondary | ICD-10-CM

## 2023-11-11 DIAGNOSIS — I340 Nonrheumatic mitral (valve) insufficiency: Secondary | ICD-10-CM

## 2023-11-11 DIAGNOSIS — G4733 Obstructive sleep apnea (adult) (pediatric): Secondary | ICD-10-CM | POA: Diagnosis not present

## 2023-11-11 DIAGNOSIS — E782 Mixed hyperlipidemia: Secondary | ICD-10-CM

## 2023-11-11 NOTE — Progress Notes (Signed)
 Cardiology Office Note   Date:  11/11/2023   ID:  Tiffine, Henigan [birth year], MRN [id number]  PCP:  Fernand Fredy RAMAN, MD  Cardiologist:  Denyse Fernand, MD      History of Present Illness: Savannah Myers is a 63 y.o. female who presents for  Chief Complaint  Patient presents with   Follow-up    Echo results    Feeling better with less SOB.      Past Medical History:  Diagnosis Date   CHF (congestive heart failure) (HCC)    Depression    Hypertension    Urinary incontinence      Past Surgical History:  Procedure Laterality Date   ABDOMINAL HYSTERECTOMY     with RSO   LEFT HEART CATH AND CORONARY ANGIOGRAPHY N/A 06/20/2020   Procedure: LEFT HEART CATH AND CORONARY ANGIOGRAPHY;  Surgeon: Ammon Blunt, MD;  Location: [facility];  Service: Cardiovascular;  Laterality: N/A;   OVARY SURGERY     tumor removal   SALPINGOOPHORECTOMY Left 1986   benign ovarian mass     Current Outpatient Medications  Medication Sig Dispense Refill   atorvastatin  (LIPITOR) 20 MG tablet Take 20 mg by mouth daily.     estradiol  (ESTRACE ) 0.1 MG/GM vaginal cream Place 0.5 g vaginally 2 (two) times a week. Place 0.5g nightly for two weeks then twice a week after 30 g 3   melatonin 3 MG TABS tablet Take 6 mg by mouth at bedtime.     Multiple Vitamin (MULTIVITAMIN ADULT PO) Take by mouth.     telmisartan (MICARDIS) 20 MG tablet TAKE 1 TABLET BY MOUTH EVERY DAY **MAKE FOLLOW UP APPT WITH DR. Makaio Mach FOR REFILLS** 90 tablet 1   VALERIAN ROOT PO Take by mouth.     Cholecalciferol (VITAMIN D3 PO) Take by mouth. (Patient not taking: Reported on 11/11/2023)     No current facility-administered medications for this visit.    Allergies:   Patient has no known allergies.    Social History:   reports that she has never smoked. She has never used smokeless tobacco. She reports that she does not drink alcohol and does not use drugs.   Family History:  family history  includes Breast cancer (age of onset: 57) in her maternal grandmother.    ROS:     Review of Systems  Constitutional: Negative.   HENT: Negative.    Eyes: Negative.   Respiratory: Negative.    Gastrointestinal: Negative.   Genitourinary: Negative.   Musculoskeletal: Negative.   Skin: Negative.   Neurological: Negative.   Endo/Heme/Allergies: Negative.   Psychiatric/Behavioral: Negative.    All other systems reviewed and are negative.     All other systems are reviewed and negative.    PHYSICAL EXAM: VS:  BP 121/81   Pulse 69   Ht 5' 4 (1.626 m)   Wt 151 lb 6.4 oz (68.7 kg)   LMP  (LMP Unknown)   SpO2 97%   BMI 25.99 kg/m  , BMI Body mass index is 25.99 kg/m. Last weight:  Wt Readings from Last 3 Encounters:  11/11/23 151 lb 6.4 oz (68.7 kg)  09/16/23 146 lb (66.2 kg)  09/09/23 150 lb 6.4 oz (68.2 kg)     Physical Exam Constitutional:      Appearance: Normal appearance.   Cardiovascular:     Rate and Rhythm: Normal rate and regular rhythm.     Heart sounds: Normal heart sounds.  Pulmonary:  Effort: Pulmonary effort is normal.     Breath sounds: Normal breath sounds.   Musculoskeletal:     Right lower leg: No edema.     Left lower leg: No edema.   Neurological:     Mental Status: She is alert.       EKG:   Recent Labs: 05/06/2023: Hemoglobin 14.0; Platelets 328 09/16/2023: ALT 32; BUN 29; Creatinine, Ser 0.63; Potassium 4.7; Sodium 146    Lipid Panel    Component Value Date/Time   CHOL 148 09/16/2023 0851   TRIG 98 09/16/2023 0851   HDL 53 09/16/2023 0851   CHOLHDL 2.9 06/18/2020 0018   VLDL 9 06/18/2020 0018   LDLCALC 77 09/16/2023 0851      Other studies Reviewed: Additional studies/ records that were reviewed today include:  Review of the above records demonstrates:       No data to display            ASSESSMENT AND PLAN:    ICD-10-CM   1. Mixed hyperlipidemia  E78.2     2. Essential hypertension, benign  I10      3. Other chest pain  R07.89    Denies chest pain, stress test was not done. She did not want to do it.    4. Obstructive sleep apnea  G47.33     5. Nonrheumatic mitral valve regurgitation  I34.0    trace MR with normal LVEF.       Problem List Items Addressed This Visit       Cardiovascular and Mediastinum   Essential hypertension, benign     Respiratory   Obstructive sleep apnea     Other   Hyperlipidemia - Primary   Other Visit Diagnoses       Other chest pain       Denies chest pain, stress test was not done. She did not want to do it.     Nonrheumatic mitral valve regurgitation       trace MR with normal LVEF.          Disposition:   Return in about 3 months (around 02/11/2024).    Total time spent: 35 minutes  Signed,  Denyse Bathe, MD  11/11/2023 1:37 PM    [facility]

## 2023-12-09 ENCOUNTER — Ambulatory Visit: Admitting: Internal Medicine

## 2023-12-09 ENCOUNTER — Encounter: Payer: Self-pay | Admitting: Internal Medicine

## 2023-12-09 VITALS — BP 98/66 | HR 56 | Ht 64.0 in | Wt 151.6 lb

## 2023-12-09 DIAGNOSIS — G4733 Obstructive sleep apnea (adult) (pediatric): Secondary | ICD-10-CM

## 2023-12-09 DIAGNOSIS — R5383 Other fatigue: Secondary | ICD-10-CM | POA: Diagnosis not present

## 2023-12-09 DIAGNOSIS — R1013 Epigastric pain: Secondary | ICD-10-CM

## 2023-12-09 DIAGNOSIS — I10 Essential (primary) hypertension: Secondary | ICD-10-CM | POA: Diagnosis not present

## 2023-12-09 DIAGNOSIS — F411 Generalized anxiety disorder: Secondary | ICD-10-CM

## 2023-12-09 MED ORDER — PANTOPRAZOLE SODIUM 40 MG PO TBEC: 40.0000 mg | DELAYED_RELEASE_TABLET | Freq: Every day | ORAL | 1 refills | Status: AC

## 2023-12-09 NOTE — Progress Notes (Signed)
 Established Patient Office Visit  Subjective:  Patient ID: Savannah Myers, female    DOB: Sep 06, 1960  Age: 63 y.o. MRN: [id number]  Chief Complaint  Patient presents with   Follow-up    3 month follow up    Patient comes in for her regular follow-up today.  Her blood pressure is low and she is feeling a little lightheaded and weak.  Patient reports that she does not get enough sleep at night, but she has been diagnosed with obstructive sleep apnea had still has not picked up her CPAP equipment.  She missed her last appointment with them and did not schedule another one.  Will reach out to to the DME company  to set up another appointment to get her started on CPAP treatment. She also mentioned that last week she had at mid epigastric pain with some nausea and vomiting.  Did not have any fevers or chills.  There was no diarrhea or constipation but felt bloated.  Does not recall eating anything unusual.  Will check her H. pylori and get her started on Protonix . She mentions feeling tired and fatigued, and as her blood pressure is low she is advised to hold off her Micardis, check her blood pressure every morning before taking it. Denies any back pain, no dysuria, no burning micturition.    No other concerns at this time.   Past Medical History:  Diagnosis Date   CHF (congestive heart failure) (HCC)    Depression    Hypertension    Urinary incontinence     Past Surgical History:  Procedure Laterality Date   ABDOMINAL HYSTERECTOMY     with RSO   LEFT HEART CATH AND CORONARY ANGIOGRAPHY N/A 06/20/2020   Procedure: LEFT HEART CATH AND CORONARY ANGIOGRAPHY;  Surgeon: Ammon Blunt, MD;  Location: [facility];  Service: Cardiovascular;  Laterality: N/A;   OVARY SURGERY     tumor removal   SALPINGOOPHORECTOMY Left 1986   benign ovarian mass    Social History   Socioeconomic History   Marital status: Married    Spouse name: Not on file   Number of children:  Not on file   Years of education: Not on file   Highest education level: Not on file  Occupational History   Not on file  Tobacco Use   Smoking status: Never   Smokeless tobacco: Never  Vaping Use   Vaping status: Never Used  Substance and Sexual Activity   Alcohol use: No   Drug use: No   Sexual activity: Not Currently    Partners: Male    Birth control/protection: Surgical  Other Topics Concern   Not on file  Social History Narrative   Not on file   Social Drivers of Health   Financial Resource Strain: Not on file  Food Insecurity: Not on file  Transportation Needs: No Transportation Needs (07/01/2020)   PRAPARE - Transportation    Lack of Transportation (Medical): No    Lack of Transportation (Non-Medical): No  Physical Activity: Not on file  Stress: Not on file  Social Connections: Not on file  Intimate Partner Violence: Not on file    Family History  Problem Relation Age of Onset   Breast cancer Maternal Grandmother 63   Bladder Cancer Neg Hx    Uterine cancer Neg Hx     No Known Allergies  Outpatient Medications Prior to Visit  Medication Sig   atorvastatin  (LIPITOR) 20 MG tablet Take 20 mg by mouth  daily.   estradiol  (ESTRACE ) 0.1 MG/GM vaginal cream Place 0.5 g vaginally 2 (two) times a week. Place 0.5g nightly for two weeks then twice a week after   melatonin 3 MG TABS tablet Take 6 mg by mouth at bedtime.   telmisartan (MICARDIS) 20 MG tablet TAKE 1 TABLET BY MOUTH EVERY DAY **MAKE FOLLOW UP APPT WITH DR. Jaliel Deavers FOR REFILLS**   VALERIAN ROOT PO Take by mouth.   Cholecalciferol (VITAMIN D3 PO) Take by mouth. (Patient not taking: Reported on 12/09/2023)   Multiple Vitamin (MULTIVITAMIN ADULT PO) Take by mouth. (Patient not taking: Reported on 12/09/2023)   No facility-administered medications prior to visit.    Review of Systems  Constitutional:  Positive for malaise/fatigue. Negative for chills, diaphoresis, fever and weight loss.  HENT: Negative.   Negative for sore throat.   Eyes: Negative.   Respiratory: Negative.  Negative for cough and shortness of breath.   Cardiovascular: Negative.  Negative for chest pain, palpitations and leg swelling.  Gastrointestinal:  Positive for heartburn and nausea. Negative for abdominal pain, constipation, diarrhea and vomiting.  Genitourinary: Negative.  Negative for dysuria and flank pain.  Musculoskeletal: Negative.  Negative for joint pain and myalgias.  Skin: Negative.   Neurological: Negative.  Negative for dizziness, tingling, tremors, sensory change and headaches.  Endo/Heme/Allergies: Negative.   Psychiatric/Behavioral: Negative.  Negative for depression and suicidal ideas. The patient is not nervous/anxious.        Objective:   BP 98/66   Pulse (!) 56   Ht 5' 4 (1.626 m)   Wt 151 lb 9.6 oz (68.8 kg)   LMP  (LMP Unknown)   SpO2 99%   BMI 26.02 kg/m   Vitals:   12/09/23 1026  BP: 98/66  Pulse: (!) 56  Height: 5' 4 (1.626 m)  Weight: 151 lb 9.6 oz (68.8 kg)  SpO2: 99%  BMI (Calculated): 26.01    Physical Exam Vitals and nursing note reviewed.  Constitutional:      Appearance: Normal appearance.  HENT:     Head: Normocephalic and atraumatic.     Nose: Nose normal.     Mouth/Throat:     Mouth: Mucous membranes are moist.     Pharynx: Oropharynx is clear.  Eyes:     Conjunctiva/sclera: Conjunctivae normal.     Pupils: Pupils are equal, round, and reactive to light.  Cardiovascular:     Rate and Rhythm: Normal rate and regular rhythm.     Pulses: Normal pulses.     Heart sounds: Normal heart sounds. No murmur heard. Pulmonary:     Effort: Pulmonary effort is normal.     Breath sounds: Normal breath sounds. No wheezing.  Abdominal:     General: Bowel sounds are normal.     Palpations: Abdomen is soft.     Tenderness: There is no abdominal tenderness. There is no right CVA tenderness or left CVA tenderness.  Musculoskeletal:        General: Normal range of motion.      Cervical back: Normal range of motion.     Right lower leg: No edema.     Left lower leg: No edema.  Skin:    General: Skin is warm and dry.  Neurological:     General: No focal deficit present.     Mental Status: She is alert and oriented to person, place, and time.  Psychiatric:        Mood and Affect: Mood normal.  Behavior: Behavior normal.      No results found for any visits on 12/09/23.  Recent Results (from the past 2160 hours)  CMP14+EGFR     Status: Abnormal   Collection Time: 09/16/23  8:51 AM  Result Value Ref Range   Glucose 97 70 - 99 mg/dL   BUN 29 (H) 8 - 27 mg/dL   Creatinine, Ser 9.36 0.57 - 1.00 mg/dL   eGFR 99 >40 fO/fpw/8.26   BUN/Creatinine Ratio 46 (H) 12 - 28   Sodium 146 (H) 134 - 144 mmol/L   Potassium 4.7 3.5 - 5.2 mmol/L   Chloride 107 (H) 96 - 106 mmol/L   CO2 25 20 - 29 mmol/L   Calcium  10.3 8.7 - 10.3 mg/dL   Total Protein 6.6 6.0 - 8.5 g/dL   Albumin 4.7 3.9 - 4.9 g/dL   Globulin, Total 1.9 1.5 - 4.5 g/dL   Bilirubin Total 0.4 0.0 - 1.2 mg/dL   Alkaline Phosphatase 69 44 - 121 IU/L   AST 27 0 - 40 IU/L   ALT 32 0 - 32 IU/L  Lipid Panel w/o Chol/HDL Ratio     Status: None   Collection Time: 09/16/23  8:51 AM  Result Value Ref Range   Cholesterol, Total 148 100 - 199 mg/dL   Triglycerides 98 0 - 149 mg/dL   HDL 53 >60 mg/dL   VLDL Cholesterol Cal 18 5 - 40 mg/dL   LDL Chol Calc (NIH) 77 0 - 99 mg/dL      Assessment & Plan:  Check blood work today.  Set up CPAP.  Start Protonix .  Monitor blood pressure at home, and hold of Micardis if it is below 110 systolic.  Patient encouraged to drink more water. Problem List Items Addressed This Visit     Essential hypertension, benign   Relevant Orders   CMP14+EGFR   GAD (generalized anxiety disorder)   Obstructive sleep apnea   Other Visit Diagnoses       Epigastric pain    -  Primary   Relevant Medications   pantoprazole  (PROTONIX ) 40 MG tablet   Other Relevant Orders    H. pylori breath test   CBC with Diff   Lipase     Other fatigue       Relevant Orders   Vitamin B12       Return in about 4 weeks (around 01/06/2024).   Total time spent: 30 minutes  FERNAND FREDY RAMAN, MD  12/09/2023   This document may have been prepared by [facility] Voice Recognition software and as such may include unintentional dictation errors.

## 2023-12-10 ENCOUNTER — Other Ambulatory Visit

## 2023-12-11 LAB — CMP14+EGFR
ALT: 28 IU/L (ref 0–32)
AST: 24 IU/L (ref 0–40)
Albumin: 4.7 g/dL (ref 3.9–4.9)
Alkaline Phosphatase: 75 IU/L (ref 44–121)
BUN/Creatinine Ratio: 26 (ref 12–28)
BUN: 19 mg/dL (ref 8–27)
Bilirubin Total: 0.4 mg/dL (ref 0.0–1.2)
CO2: 21 mmol/L (ref 20–29)
Calcium: 10.4 mg/dL — ABNORMAL HIGH (ref 8.7–10.3)
Chloride: 107 mmol/L — ABNORMAL HIGH (ref 96–106)
Creatinine, Ser: 0.73 mg/dL (ref 0.57–1.00)
Globulin, Total: 1.9 g/dL (ref 1.5–4.5)
Glucose: 94 mg/dL (ref 70–99)
Potassium: 4.6 mmol/L (ref 3.5–5.2)
Sodium: 144 mmol/L (ref 134–144)
Total Protein: 6.6 g/dL (ref 6.0–8.5)
eGFR: 92 mL/min/1.73 (ref >59)

## 2023-12-11 LAB — CBC WITH DIFFERENTIAL/PLATELET
Basophils Absolute: 0 x10E3/uL (ref 0.0–0.2)
Basos: 1 %
EOS (ABSOLUTE): 0.2 x10E3/uL (ref 0.0–0.4)
Eos: 3 %
Hematocrit: 45.6 % (ref 34.0–46.6)
Hemoglobin: 14.3 g/dL (ref 11.1–15.9)
Immature Grans (Abs): 0 x10E3/uL (ref 0.0–0.1)
Immature Granulocytes: 0 %
Lymphocytes Absolute: 2.6 x10E3/uL (ref 0.7–3.1)
Lymphs: 45 %
MCH: 28.4 pg (ref 26.6–33.0)
MCHC: 31.4 g/dL — ABNORMAL LOW (ref 31.5–35.7)
MCV: 91 fL (ref 79–97)
Monocytes Absolute: 0.3 x10E3/uL (ref 0.1–0.9)
Monocytes: 6 %
Neutrophils Absolute: 2.5 x10E3/uL (ref 1.4–7.0)
Neutrophils: 45 %
Platelets: 271 x10E3/uL (ref 150–450)
RBC: 5.03 x10E6/uL (ref 3.77–5.28)
RDW: 13.8 % (ref 11.7–15.4)
WBC: 5.6 x10E3/uL (ref 3.4–10.8)

## 2023-12-11 LAB — VITAMIN B12: Vitamin B-12: 949 pg/mL (ref 232–1245)

## 2023-12-11 LAB — LIPASE: Lipase: 22 U/L (ref 14–72)

## 2023-12-11 LAB — H. PYLORI BREATH TEST: H pylori Breath Test: NEGATIVE

## 2023-12-12 ENCOUNTER — Ambulatory Visit: Payer: Self-pay | Admitting: Cardiology

## 2023-12-24 ENCOUNTER — Encounter: Admitting: Physical Therapy

## 2024-01-02 ENCOUNTER — Encounter: Payer: Self-pay | Admitting: Physical Therapy

## 2024-01-06 ENCOUNTER — Ambulatory Visit: Admitting: Internal Medicine

## 2024-01-31 ENCOUNTER — Ambulatory Visit: Admitting: Internal Medicine

## 2024-04-06 ENCOUNTER — Other Ambulatory Visit: Payer: Self-pay | Admitting: Cardiovascular Disease

## 2024-04-06 DIAGNOSIS — E782 Mixed hyperlipidemia: Secondary | ICD-10-CM
